# Patient Record
Sex: Female | Born: 1940 | Race: Black or African American | Hispanic: No | Marital: Married | Smoking: Never smoker
Health system: Southern US, Community
[De-identification: ages and names within clinical notes are randomized; demographics above are authoritative.]

## PROBLEM LIST (undated history)

## (undated) DIAGNOSIS — D649 Anemia, unspecified: Secondary | ICD-10-CM

## (undated) DIAGNOSIS — M1990 Unspecified osteoarthritis, unspecified site: Secondary | ICD-10-CM

## (undated) DIAGNOSIS — Z889 Allergy status to unspecified drugs, medicaments and biological substances status: Secondary | ICD-10-CM

## (undated) DIAGNOSIS — Z972 Presence of dental prosthetic device (complete) (partial): Secondary | ICD-10-CM

## (undated) DIAGNOSIS — I10 Essential (primary) hypertension: Secondary | ICD-10-CM

## (undated) HISTORY — PX: BACK SURGERY: SHX140

## (undated) HISTORY — PX: ABDOMINAL HYSTERECTOMY: SHX81

---

## 2004-08-22 ENCOUNTER — Ambulatory Visit: Payer: Self-pay | Admitting: Obstetrics and Gynecology

## 2005-09-04 ENCOUNTER — Ambulatory Visit: Payer: Self-pay | Admitting: Obstetrics and Gynecology

## 2006-07-22 ENCOUNTER — Other Ambulatory Visit: Payer: Self-pay

## 2006-07-22 ENCOUNTER — Ambulatory Visit: Payer: Self-pay | Admitting: Podiatry

## 2006-07-26 ENCOUNTER — Ambulatory Visit: Payer: Self-pay | Admitting: Podiatry

## 2007-01-20 ENCOUNTER — Ambulatory Visit: Payer: Self-pay | Admitting: Family Medicine

## 2008-01-22 ENCOUNTER — Ambulatory Visit: Payer: Self-pay | Admitting: Family Medicine

## 2009-02-01 ENCOUNTER — Ambulatory Visit: Payer: Self-pay | Admitting: Family Medicine

## 2010-02-02 ENCOUNTER — Ambulatory Visit: Payer: Self-pay | Admitting: Family Medicine

## 2011-02-21 ENCOUNTER — Ambulatory Visit: Payer: Self-pay | Admitting: Unknown Physician Specialty

## 2011-03-07 ENCOUNTER — Ambulatory Visit: Payer: Self-pay | Admitting: Unknown Physician Specialty

## 2011-03-13 ENCOUNTER — Inpatient Hospital Stay: Payer: Self-pay | Admitting: Unknown Physician Specialty

## 2011-03-16 ENCOUNTER — Encounter: Payer: Self-pay | Admitting: Internal Medicine

## 2011-03-18 DIAGNOSIS — I499 Cardiac arrhythmia, unspecified: Secondary | ICD-10-CM

## 2011-04-09 ENCOUNTER — Encounter: Payer: Self-pay | Admitting: Internal Medicine

## 2011-06-15 ENCOUNTER — Ambulatory Visit: Payer: Self-pay | Admitting: Family Medicine

## 2011-07-11 ENCOUNTER — Ambulatory Visit: Payer: Self-pay

## 2011-07-19 ENCOUNTER — Ambulatory Visit: Payer: Self-pay | Admitting: Urology

## 2011-08-13 ENCOUNTER — Ambulatory Visit: Payer: Self-pay | Admitting: Gastroenterology

## 2013-01-05 ENCOUNTER — Ambulatory Visit: Payer: Self-pay | Admitting: Internal Medicine

## 2013-12-30 DIAGNOSIS — H18892 Other specified disorders of cornea, left eye: Secondary | ICD-10-CM | POA: Insufficient documentation

## 2013-12-30 DIAGNOSIS — I10 Essential (primary) hypertension: Secondary | ICD-10-CM | POA: Insufficient documentation

## 2013-12-30 DIAGNOSIS — H401130 Primary open-angle glaucoma, bilateral, stage unspecified: Secondary | ICD-10-CM | POA: Insufficient documentation

## 2013-12-30 DIAGNOSIS — H04123 Dry eye syndrome of bilateral lacrimal glands: Secondary | ICD-10-CM | POA: Insufficient documentation

## 2014-01-06 DIAGNOSIS — E669 Obesity, unspecified: Secondary | ICD-10-CM | POA: Insufficient documentation

## 2014-02-01 ENCOUNTER — Ambulatory Visit: Payer: Self-pay | Admitting: Internal Medicine

## 2014-07-12 DIAGNOSIS — I10 Essential (primary) hypertension: Secondary | ICD-10-CM | POA: Diagnosis not present

## 2014-07-12 DIAGNOSIS — N289 Disorder of kidney and ureter, unspecified: Secondary | ICD-10-CM | POA: Diagnosis not present

## 2014-07-12 DIAGNOSIS — M25569 Pain in unspecified knee: Secondary | ICD-10-CM | POA: Diagnosis not present

## 2014-07-19 DIAGNOSIS — M1712 Unilateral primary osteoarthritis, left knee: Secondary | ICD-10-CM | POA: Diagnosis not present

## 2014-08-12 DIAGNOSIS — M1712 Unilateral primary osteoarthritis, left knee: Secondary | ICD-10-CM | POA: Diagnosis not present

## 2014-08-19 DIAGNOSIS — M1712 Unilateral primary osteoarthritis, left knee: Secondary | ICD-10-CM | POA: Diagnosis not present

## 2014-08-26 DIAGNOSIS — M1712 Unilateral primary osteoarthritis, left knee: Secondary | ICD-10-CM | POA: Diagnosis not present

## 2014-12-08 DIAGNOSIS — H40003 Preglaucoma, unspecified, bilateral: Secondary | ICD-10-CM | POA: Diagnosis not present

## 2015-01-12 DIAGNOSIS — I10 Essential (primary) hypertension: Secondary | ICD-10-CM | POA: Diagnosis not present

## 2015-02-02 DIAGNOSIS — M542 Cervicalgia: Secondary | ICD-10-CM | POA: Diagnosis not present

## 2015-02-02 DIAGNOSIS — M25511 Pain in right shoulder: Secondary | ICD-10-CM | POA: Diagnosis not present

## 2015-02-17 ENCOUNTER — Other Ambulatory Visit: Payer: Self-pay | Admitting: Internal Medicine

## 2015-02-17 DIAGNOSIS — Z1231 Encounter for screening mammogram for malignant neoplasm of breast: Secondary | ICD-10-CM

## 2015-02-22 ENCOUNTER — Ambulatory Visit
Admission: RE | Admit: 2015-02-22 | Discharge: 2015-02-22 | Disposition: A | Discharge status: Home / Self Care | Payer: Commercial Managed Care - HMO | Source: Ambulatory Visit | Attending: Internal Medicine | Admitting: Internal Medicine

## 2015-02-22 ENCOUNTER — Other Ambulatory Visit: Payer: Self-pay | Admitting: Internal Medicine

## 2015-02-22 DIAGNOSIS — Z1231 Encounter for screening mammogram for malignant neoplasm of breast: Secondary | ICD-10-CM | POA: Diagnosis not present

## 2015-04-04 DIAGNOSIS — N289 Disorder of kidney and ureter, unspecified: Secondary | ICD-10-CM | POA: Diagnosis not present

## 2015-04-04 DIAGNOSIS — M25511 Pain in right shoulder: Secondary | ICD-10-CM | POA: Diagnosis not present

## 2015-04-19 DIAGNOSIS — S161XXA Strain of muscle, fascia and tendon at neck level, initial encounter: Secondary | ICD-10-CM | POA: Diagnosis not present

## 2015-04-19 DIAGNOSIS — N289 Disorder of kidney and ureter, unspecified: Secondary | ICD-10-CM | POA: Diagnosis not present

## 2015-04-19 DIAGNOSIS — S161XXS Strain of muscle, fascia and tendon at neck level, sequela: Secondary | ICD-10-CM | POA: Diagnosis not present

## 2015-04-19 DIAGNOSIS — M47812 Spondylosis without myelopathy or radiculopathy, cervical region: Secondary | ICD-10-CM | POA: Diagnosis not present

## 2015-06-06 DIAGNOSIS — H40003 Preglaucoma, unspecified, bilateral: Secondary | ICD-10-CM | POA: Diagnosis not present

## 2015-06-17 DIAGNOSIS — H40003 Preglaucoma, unspecified, bilateral: Secondary | ICD-10-CM | POA: Diagnosis not present

## 2015-07-08 DIAGNOSIS — I10 Essential (primary) hypertension: Secondary | ICD-10-CM | POA: Diagnosis not present

## 2015-07-15 DIAGNOSIS — M25562 Pain in left knee: Secondary | ICD-10-CM | POA: Diagnosis not present

## 2015-07-15 DIAGNOSIS — N289 Disorder of kidney and ureter, unspecified: Secondary | ICD-10-CM | POA: Diagnosis not present

## 2015-07-15 DIAGNOSIS — E6609 Other obesity due to excess calories: Secondary | ICD-10-CM | POA: Diagnosis not present

## 2015-07-15 DIAGNOSIS — E785 Hyperlipidemia, unspecified: Secondary | ICD-10-CM | POA: Insufficient documentation

## 2015-07-15 DIAGNOSIS — I10 Essential (primary) hypertension: Secondary | ICD-10-CM | POA: Diagnosis not present

## 2015-07-15 DIAGNOSIS — Z0001 Encounter for general adult medical examination with abnormal findings: Secondary | ICD-10-CM | POA: Diagnosis not present

## 2015-08-05 DIAGNOSIS — M1712 Unilateral primary osteoarthritis, left knee: Secondary | ICD-10-CM | POA: Diagnosis not present

## 2015-08-05 DIAGNOSIS — M25562 Pain in left knee: Secondary | ICD-10-CM | POA: Diagnosis not present

## 2015-08-16 DIAGNOSIS — M1712 Unilateral primary osteoarthritis, left knee: Secondary | ICD-10-CM | POA: Diagnosis not present

## 2015-09-14 ENCOUNTER — Encounter
Admission: RE | Admit: 2015-09-14 | Discharge: 2015-09-14 | Disposition: A | Discharge status: Home / Self Care | Payer: Commercial Managed Care - HMO | Source: Ambulatory Visit | Attending: Orthopedic Surgery | Admitting: Orthopedic Surgery

## 2015-09-14 DIAGNOSIS — Z01810 Encounter for preprocedural cardiovascular examination: Secondary | ICD-10-CM | POA: Diagnosis not present

## 2015-09-14 DIAGNOSIS — I10 Essential (primary) hypertension: Secondary | ICD-10-CM | POA: Diagnosis not present

## 2015-09-14 DIAGNOSIS — Z01812 Encounter for preprocedural laboratory examination: Secondary | ICD-10-CM | POA: Insufficient documentation

## 2015-09-14 HISTORY — DX: Essential (primary) hypertension: I10

## 2015-09-14 HISTORY — DX: Allergy status to unspecified drugs, medicaments and biological substances: Z88.9

## 2015-09-14 HISTORY — DX: Anemia, unspecified: D64.9

## 2015-09-14 HISTORY — DX: Unspecified osteoarthritis, unspecified site: M19.90

## 2015-09-14 LAB — BASIC METABOLIC PANEL
Anion gap: 9 (ref 5–15)
BUN: 14 mg/dL (ref 6–20)
CHLORIDE: 100 mmol/L — AB (ref 101–111)
CO2: 28 mmol/L (ref 22–32)
CREATININE: 1.39 mg/dL — AB (ref 0.44–1.00)
Calcium: 9.7 mg/dL (ref 8.9–10.3)
GFR calc Af Amer: 42 mL/min — ABNORMAL LOW (ref >60)
GFR, EST NON AFRICAN AMERICAN: 36 mL/min — AB (ref >60)
GLUCOSE: 90 mg/dL (ref 65–99)
Potassium: 3 mmol/L — ABNORMAL LOW (ref 3.5–5.1)
SODIUM: 137 mmol/L (ref 135–145)

## 2015-09-14 LAB — URINALYSIS COMPLETE WITH MICROSCOPIC (ARMC ONLY)
BACTERIA UA: NONE SEEN
BILIRUBIN URINE: NEGATIVE
GLUCOSE, UA: NEGATIVE mg/dL
Hgb urine dipstick: NEGATIVE
Ketones, ur: NEGATIVE mg/dL
Leukocytes, UA: NEGATIVE
NITRITE: NEGATIVE
Protein, ur: NEGATIVE mg/dL
RBC / HPF: NONE SEEN RBC/hpf (ref 0–5)
SPECIFIC GRAVITY, URINE: 1.011 (ref 1.005–1.030)
WBC, UA: NONE SEEN WBC/hpf (ref 0–5)
pH: 7 (ref 5.0–8.0)

## 2015-09-14 LAB — CBC
HCT: 34.5 % — ABNORMAL LOW (ref 35.0–47.0)
Hemoglobin: 11.7 g/dL — ABNORMAL LOW (ref 12.0–16.0)
MCH: 27.7 pg (ref 26.0–34.0)
MCHC: 33.9 g/dL (ref 32.0–36.0)
MCV: 81.7 fL (ref 80.0–100.0)
PLATELETS: 339 10*3/uL (ref 150–440)
RBC: 4.22 MIL/uL (ref 3.80–5.20)
RDW: 16.1 % — ABNORMAL HIGH (ref 11.5–14.5)
WBC: 11.2 10*3/uL — ABNORMAL HIGH (ref 3.6–11.0)

## 2015-09-14 LAB — SURGICAL PCR SCREEN
MRSA, PCR: NEGATIVE
Staphylococcus aureus: NEGATIVE

## 2015-09-14 LAB — TYPE AND SCREEN
ABO/RH(D): A POS
ANTIBODY SCREEN: NEGATIVE

## 2015-09-14 LAB — PROTIME-INR
INR: 1.13
Prothrombin Time: 14.7 seconds (ref 11.4–15.0)

## 2015-09-14 LAB — SEDIMENTATION RATE: Sed Rate: 58 mm/hr — ABNORMAL HIGH (ref 0–30)

## 2015-09-14 LAB — ABO/RH: ABO/RH(D): A POS

## 2015-09-14 LAB — APTT: APTT: 34 s (ref 24–36)

## 2015-09-14 NOTE — Patient Instructions (Addendum)
  Your procedure is scheduled on:September 28, 2015 (Wednesday) Report to Day Surgery.[facility]) Second Floor To find out your arrival time please call [phone] between 1PM - 3PM on September 27, 2015 (Tuesday).  Remember: Instructions that are not followed completely may result in serious medical risk, up to and including death, or upon the discretion of your surgeon and anesthesiologist your surgery may need to be rescheduled.    __x__ 1. Do not eat food or drink liquids after midnight. No gum chewing or hard candies.     __x__ 2. No Alcohol for 24 hours before or after surgery.   ____ 3. Bring all medications with you on the day of surgery if instructed.    __x_ 4. Notify your doctor if there is any change in your medical condition     (cold, fever, infections).     Do not wear jewelry, make-up, hairpins, clips or nail polish.  Do not wear lotions, powders, or perfumes. You may wear deodorant.  Do not shave 48 hours prior to surgery. Men may shave face and neck.  Do not bring valuables to the hospital.    [facility] any belongings or valuables.               Contacts, dentures or bridgework may not be worn into surgery.  Leave your suitcase in the car. After surgery it may be brought to your room.  For patients admitted to the hospital, discharge time is determined by your                treatment team.   Patients discharged the day of surgery will not be allowed to drive home.   Please read over the following fact sheets that you were given:   MRSA Information and Surgical Site Infection Prevention   ____ Take these medicines the morning of surgery with A SIP OF WATER:    1.   2.   3.   4.  5.  6.  ____ Fleet Enema (as directed)   __x__ Use CHG Soap as directed  ____ Use inhalers on the day of surgery  ____ Stop metformin 2 days prior to surgery    ____ Take 1/2 of usual insulin dose the night before surgery and none on the morning of  surgery.   __x__ Stop Coumadin/Plavix/aspirin on (STOP ASPIRIN ONE WEEK BEFORE SURGERY)  __x__ Stop Anti-inflammatories on (NO NSAIDS) TYLENOL OK TO TAKE FOR PAIN IF NEEDED   ____ Stop supplements until after surgery.    ____ Bring C-Pap to the hospital.

## 2015-09-15 LAB — URINE CULTURE

## 2015-09-15 NOTE — Pre-Procedure Instructions (Signed)
AS INSTRUCTED BY DR Lorin PicketM THOMAS, REQUEST FOR CLEARANCE /EKG/LABS WITH KT 3.0 CALLED AND FAXED TO Consuella LoseELAINE AT DR [facility]

## 2015-09-15 NOTE — Pre-Procedure Instructions (Signed)
Notified and faxed Consuella LoseElaine at Dr Ernest PineHooten office of Urine Cx results

## 2015-09-16 NOTE — Pre-Procedure Instructions (Signed)
Received fax from Consuella LoseElaine at Dr Ernest PineHooten office "UA OKAY NO S&S UTI", fax placed in chart

## 2015-09-28 ENCOUNTER — Inpatient Hospital Stay
Admission: RE | Admit: 2015-09-28 | Discharge: 2015-09-30 | DRG: 470 | Disposition: A | Discharge status: Home Health | Payer: Commercial Managed Care - HMO | Source: Ambulatory Visit | Attending: Orthopedic Surgery | Admitting: Orthopedic Surgery

## 2015-09-28 ENCOUNTER — Inpatient Hospital Stay: Payer: Commercial Managed Care - HMO

## 2015-09-28 ENCOUNTER — Inpatient Hospital Stay: Payer: Commercial Managed Care - HMO | Admitting: Anesthesiology

## 2015-09-28 ENCOUNTER — Encounter: Payer: Self-pay | Admitting: Orthopedic Surgery

## 2015-09-28 ENCOUNTER — Encounter
Admission: RE | Disposition: A | Discharge status: Home Health | Payer: Self-pay | Source: Ambulatory Visit | Attending: Orthopedic Surgery

## 2015-09-28 DIAGNOSIS — H409 Unspecified glaucoma: Secondary | ICD-10-CM | POA: Diagnosis not present

## 2015-09-28 DIAGNOSIS — M5136 Other intervertebral disc degeneration, lumbar region: Secondary | ICD-10-CM | POA: Diagnosis present

## 2015-09-28 DIAGNOSIS — Z888 Allergy status to other drugs, medicaments and biological substances status: Secondary | ICD-10-CM

## 2015-09-28 DIAGNOSIS — Z981 Arthrodesis status: Secondary | ICD-10-CM

## 2015-09-28 DIAGNOSIS — Z7982 Long term (current) use of aspirin: Secondary | ICD-10-CM | POA: Diagnosis not present

## 2015-09-28 DIAGNOSIS — E669 Obesity, unspecified: Secondary | ICD-10-CM | POA: Diagnosis present

## 2015-09-28 DIAGNOSIS — I10 Essential (primary) hypertension: Secondary | ICD-10-CM | POA: Diagnosis not present

## 2015-09-28 DIAGNOSIS — J302 Other seasonal allergic rhinitis: Secondary | ICD-10-CM | POA: Diagnosis present

## 2015-09-28 DIAGNOSIS — Z96652 Presence of left artificial knee joint: Secondary | ICD-10-CM | POA: Diagnosis not present

## 2015-09-28 DIAGNOSIS — Z471 Aftercare following joint replacement surgery: Secondary | ICD-10-CM | POA: Diagnosis not present

## 2015-09-28 DIAGNOSIS — Z79899 Other long term (current) drug therapy: Secondary | ICD-10-CM

## 2015-09-28 DIAGNOSIS — D649 Anemia, unspecified: Secondary | ICD-10-CM | POA: Diagnosis present

## 2015-09-28 DIAGNOSIS — M1712 Unilateral primary osteoarthritis, left knee: Principal | ICD-10-CM | POA: Diagnosis present

## 2015-09-28 DIAGNOSIS — Z96659 Presence of unspecified artificial knee joint: Secondary | ICD-10-CM

## 2015-09-28 DIAGNOSIS — Z6839 Body mass index (BMI) 39.0-39.9, adult: Secondary | ICD-10-CM

## 2015-09-28 HISTORY — PX: KNEE ARTHROPLASTY: SHX992

## 2015-09-28 LAB — POCT I-STAT 4, (NA,K, GLUC, HGB,HCT)
GLUCOSE: 106 mg/dL — AB (ref 65–99)
HEMATOCRIT: 36 % (ref 36.0–46.0)
Hemoglobin: 12.2 g/dL (ref 12.0–15.0)
POTASSIUM: 3.5 mmol/L (ref 3.5–5.1)
SODIUM: 142 mmol/L (ref 135–145)

## 2015-09-28 SURGERY — ARTHROPLASTY, KNEE, TOTAL, USING IMAGELESS COMPUTER-ASSISTED NAVIGATION
Anesthesia: Spinal | Laterality: Left | Wound class: Clean

## 2015-09-28 MED ORDER — NEOMYCIN-POLYMYXIN B GU 40-200000 IR SOLN
Status: DC | PRN
  Administered 2015-09-28: 14 mL

## 2015-09-28 MED ORDER — FENTANYL CITRATE (PF) 100 MCG/2ML IJ SOLN
INTRAMUSCULAR | Status: AC
  Administered 2015-09-28: 25 ug via INTRAVENOUS
  Filled 2015-09-28: qty 2

## 2015-09-28 MED ORDER — MAGNESIUM HYDROXIDE 400 MG/5ML PO SUSP
30.0000 mL | Freq: Every day | ORAL | Status: DC | PRN
  Administered 2015-09-30: 30 mL via ORAL
  Filled 2015-09-28: qty 30

## 2015-09-28 MED ORDER — FERROUS SULFATE 325 (65 FE) MG PO TABS
325.0000 mg | ORAL_TABLET | Freq: Two times a day (BID) | ORAL | Status: DC
  Administered 2015-09-29 – 2015-09-30 (×3): 325 mg via ORAL
  Filled 2015-09-28 (×3): qty 1

## 2015-09-28 MED ORDER — ROCURONIUM BROMIDE 100 MG/10ML IV SOLN
INTRAVENOUS | Status: DC | PRN
  Administered 2015-09-28: 20 mg via INTRAVENOUS

## 2015-09-28 MED ORDER — MIDAZOLAM HCL 5 MG/5ML IJ SOLN
INTRAMUSCULAR | Status: DC | PRN
  Administered 2015-09-28 (×4): 1 mg via INTRAVENOUS

## 2015-09-28 MED ORDER — DORZOLAMIDE HCL-TIMOLOL MAL 2-0.5 % OP SOLN: 1.0000 [drp] | Freq: Two times a day (BID) | OPHTHALMIC | Status: DC

## 2015-09-28 MED ORDER — OXYBUTYNIN CHLORIDE 5 MG PO TABS
5.0000 mg | ORAL_TABLET | Freq: Every day | ORAL | Status: DC
  Administered 2015-09-28 – 2015-09-30 (×3): 5 mg via ORAL
  Filled 2015-09-28 (×3): qty 1

## 2015-09-28 MED ORDER — TRANEXAMIC ACID 1000 MG/10ML IV SOLN
1000.0000 mg | INTRAVENOUS | Status: AC
  Administered 2015-09-28: 1000 mg via INTRAVENOUS
  Filled 2015-09-28: qty 10

## 2015-09-28 MED ORDER — PHENYLEPHRINE HCL 10 MG/ML IJ SOLN
INTRAMUSCULAR | Status: DC | PRN
  Administered 2015-09-28: 100 ug via INTRAVENOUS

## 2015-09-28 MED ORDER — ENOXAPARIN SODIUM 30 MG/0.3ML ~~LOC~~ SOLN
30.0000 mg | Freq: Two times a day (BID) | SUBCUTANEOUS | Status: DC
  Administered 2015-09-29 – 2015-09-30 (×3): 30 mg via SUBCUTANEOUS
  Filled 2015-09-28 (×3): qty 0.3

## 2015-09-28 MED ORDER — FENTANYL CITRATE (PF) 100 MCG/2ML IJ SOLN
INTRAMUSCULAR | Status: DC | PRN
  Administered 2015-09-28: 20 ug via INTRAVENOUS
  Administered 2015-09-28: 30 ug via INTRAVENOUS
  Administered 2015-09-28: 50 ug via INTRAVENOUS
  Administered 2015-09-28: 100 ug via INTRAVENOUS

## 2015-09-28 MED ORDER — ACETAMINOPHEN 10 MG/ML IV SOLN
1000.0000 mg | Freq: Four times a day (QID) | INTRAVENOUS | Status: AC
  Administered 2015-09-28 – 2015-09-29 (×4): 1000 mg via INTRAVENOUS
  Filled 2015-09-28 (×4): qty 100

## 2015-09-28 MED ORDER — ONDANSETRON HCL 4 MG/2ML IJ SOLN
INTRAMUSCULAR | Status: AC
  Filled 2015-09-28: qty 2

## 2015-09-28 MED ORDER — SODIUM CHLORIDE 0.9 % IV SOLN
INTRAVENOUS | Status: DC | PRN
  Administered 2015-09-28: 60 mL

## 2015-09-28 MED ORDER — ACETAMINOPHEN 325 MG PO TABS: 650.0000 mg | ORAL_TABLET | Freq: Four times a day (QID) | ORAL | Status: DC | PRN

## 2015-09-28 MED ORDER — PROPOFOL 10 MG/ML IV BOLUS
INTRAVENOUS | Status: DC | PRN
Start: 2015-09-28 — End: 2015-09-28
  Administered 2015-09-28: 100 mg via INTRAVENOUS

## 2015-09-28 MED ORDER — FAMOTIDINE 20 MG PO TABS
20.0000 mg | ORAL_TABLET | Freq: Once | ORAL | Status: AC
  Administered 2015-09-28: 20 mg via ORAL

## 2015-09-28 MED ORDER — LATANOPROST 0.005 % OP SOLN
1.0000 [drp] | Freq: Every day | OPHTHALMIC | Status: DC
  Administered 2015-09-28 – 2015-09-29 (×2): 1 [drp] via OPHTHALMIC
  Filled 2015-09-28: qty 2.5

## 2015-09-28 MED ORDER — ALUM & MAG HYDROXIDE-SIMETH 200-200-20 MG/5ML PO SUSP: 30.0000 mL | ORAL | Status: DC | PRN

## 2015-09-28 MED ORDER — NEOSTIGMINE METHYLSULFATE 10 MG/10ML IV SOLN
INTRAVENOUS | Status: DC | PRN
  Administered 2015-09-28: 3 mg via INTRAVENOUS

## 2015-09-28 MED ORDER — CEFAZOLIN SODIUM-DEXTROSE 2-4 GM/100ML-% IV SOLN
2.0000 g | Freq: Once | INTRAVENOUS | Status: DC
  Filled 2015-09-28: qty 100

## 2015-09-28 MED ORDER — FENTANYL CITRATE (PF) 100 MCG/2ML IJ SOLN
25.0000 ug | INTRAMUSCULAR | Status: DC | PRN
  Administered 2015-09-28 (×4): 25 ug via INTRAVENOUS

## 2015-09-28 MED ORDER — CEFAZOLIN SODIUM-DEXTROSE 2-3 GM-% IV SOLR
INTRAVENOUS | Status: AC
  Administered 2015-09-28: 2 g via INTRAVENOUS
  Filled 2015-09-28: qty 50

## 2015-09-28 MED ORDER — DEXTROSE 5 % IV SOLN
Freq: Once | INTRAVENOUS | Status: AC
  Administered 2015-09-28: 12:00:00 via INTRAVENOUS
  Filled 2015-09-28: qty 50

## 2015-09-28 MED ORDER — DORZOLAMIDE HCL 2 % OP SOLN
1.0000 [drp] | Freq: Two times a day (BID) | OPHTHALMIC | Status: DC
  Administered 2015-09-28 – 2015-09-30 (×4): 1 [drp] via OPHTHALMIC
  Filled 2015-09-28: qty 10

## 2015-09-28 MED ORDER — PANTOPRAZOLE SODIUM 40 MG PO TBEC
40.0000 mg | DELAYED_RELEASE_TABLET | Freq: Two times a day (BID) | ORAL | Status: DC
  Administered 2015-09-28 – 2015-09-30 (×4): 40 mg via ORAL
  Filled 2015-09-28 (×4): qty 1

## 2015-09-28 MED ORDER — ADULT MULTIVITAMIN W/MINERALS CH
1.0000 | ORAL_TABLET | Freq: Every day | ORAL | Status: DC
Start: 2015-09-28 — End: 2015-09-30
  Administered 2015-09-28 – 2015-09-30 (×3): 1 via ORAL
  Filled 2015-09-28 (×3): qty 1

## 2015-09-28 MED ORDER — HYDROCHLOROTHIAZIDE 25 MG PO TABS
12.5000 mg | ORAL_TABLET | Freq: Every day | ORAL | Status: DC
  Administered 2015-09-29 – 2015-09-30 (×2): 12.5 mg via ORAL
  Filled 2015-09-28 (×3): qty 1

## 2015-09-28 MED ORDER — CALCIUM CARBONATE-VITAMIN D 500-200 MG-UNIT PO TABS
1.0000 | ORAL_TABLET | Freq: Every day | ORAL | Status: DC
  Administered 2015-09-28 – 2015-09-30 (×3): 1 via ORAL
  Filled 2015-09-28 (×3): qty 1

## 2015-09-28 MED ORDER — ONDANSETRON HCL 4 MG/2ML IJ SOLN
4.0000 mg | Freq: Once | INTRAMUSCULAR | Status: AC | PRN
  Administered 2015-09-28: 4 mg via INTRAVENOUS

## 2015-09-28 MED ORDER — ACETAMINOPHEN 10 MG/ML IV SOLN
INTRAVENOUS | Status: DC | PRN
  Administered 2015-09-28: 1000 mg via INTRAVENOUS

## 2015-09-28 MED ORDER — LACTATED RINGERS IV SOLN
INTRAVENOUS | Status: DC
  Administered 2015-09-28 (×3): via INTRAVENOUS

## 2015-09-28 MED ORDER — FLEET ENEMA 7-19 GM/118ML RE ENEM: 1.0000 | ENEMA | Freq: Once | RECTAL | Status: DC | PRN

## 2015-09-28 MED ORDER — SODIUM CHLORIDE 0.9 % IJ SOLN
INTRAMUSCULAR | Status: AC
  Filled 2015-09-28: qty 50

## 2015-09-28 MED ORDER — ONDANSETRON HCL 4 MG PO TABS: 4.0000 mg | ORAL_TABLET | Freq: Four times a day (QID) | ORAL | Status: DC | PRN

## 2015-09-28 MED ORDER — SODIUM CHLORIDE FLUSH 0.9 % IV SOLN
INTRAVENOUS | Status: AC
  Filled 2015-09-28: qty 3

## 2015-09-28 MED ORDER — CELECOXIB 200 MG PO CAPS
200.0000 mg | ORAL_CAPSULE | Freq: Two times a day (BID) | ORAL | Status: DC
  Administered 2015-09-28 – 2015-09-30 (×4): 200 mg via ORAL
  Filled 2015-09-28 (×4): qty 1

## 2015-09-28 MED ORDER — TRAMADOL HCL 50 MG PO TABS
50.0000 mg | ORAL_TABLET | ORAL | Status: DC | PRN
  Administered 2015-09-29: 50 mg via ORAL
  Administered 2015-09-30: 100 mg via ORAL
  Filled 2015-09-28: qty 1
  Filled 2015-09-28: qty 2

## 2015-09-28 MED ORDER — BISACODYL 10 MG RE SUPP
10.0000 mg | Freq: Every day | RECTAL | Status: DC | PRN
  Administered 2015-09-30: 10 mg via RECTAL
  Filled 2015-09-28: qty 1

## 2015-09-28 MED ORDER — ONDANSETRON HCL 4 MG/2ML IJ SOLN
INTRAMUSCULAR | Status: DC | PRN
  Administered 2015-09-28: 4 mg via INTRAVENOUS

## 2015-09-28 MED ORDER — SENNOSIDES-DOCUSATE SODIUM 8.6-50 MG PO TABS
1.0000 | ORAL_TABLET | Freq: Two times a day (BID) | ORAL | Status: DC
  Administered 2015-09-28 – 2015-09-30 (×4): 1 via ORAL
  Filled 2015-09-28 (×4): qty 1

## 2015-09-28 MED ORDER — SUCCINYLCHOLINE CHLORIDE 20 MG/ML IJ SOLN
INTRAMUSCULAR | Status: DC | PRN
  Administered 2015-09-28: 100 mg via INTRAVENOUS

## 2015-09-28 MED ORDER — DIPHENHYDRAMINE HCL 12.5 MG/5ML PO ELIX: 12.5000 mg | ORAL_SOLUTION | ORAL | Status: DC | PRN

## 2015-09-28 MED ORDER — GLYCOPYRROLATE 0.2 MG/ML IJ SOLN
INTRAMUSCULAR | Status: DC | PRN
  Administered 2015-09-28: 0.6 mg via INTRAVENOUS

## 2015-09-28 MED ORDER — BUPIVACAINE-EPINEPHRINE 0.25% -1:200000 IJ SOLN
INTRAMUSCULAR | Status: DC | PRN
  Administered 2015-09-28: 30 mL

## 2015-09-28 MED ORDER — MORPHINE SULFATE (PF) 2 MG/ML IV SOLN
2.0000 mg | INTRAVENOUS | Status: DC | PRN
  Administered 2015-09-28 (×2): 2 mg via INTRAVENOUS
  Filled 2015-09-28 (×2): qty 1

## 2015-09-28 MED ORDER — SODIUM CHLORIDE 0.9 % IV SOLN
INTRAVENOUS | Status: DC
  Administered 2015-09-28 – 2015-09-29 (×3): via INTRAVENOUS

## 2015-09-28 MED ORDER — TIMOLOL MALEATE 0.5 % OP SOLN
1.0000 [drp] | Freq: Two times a day (BID) | OPHTHALMIC | Status: DC
  Administered 2015-09-28 – 2015-09-30 (×4): 1 [drp] via OPHTHALMIC
  Filled 2015-09-28: qty 5

## 2015-09-28 MED ORDER — TRANEXAMIC ACID 1000 MG/10ML IV SOLN
1000.0000 mg | Freq: Once | INTRAVENOUS | Status: AC
  Administered 2015-09-28: 1000 mg via INTRAVENOUS
  Filled 2015-09-28: qty 10

## 2015-09-28 MED ORDER — ONDANSETRON HCL 4 MG/2ML IJ SOLN
4.0000 mg | Freq: Four times a day (QID) | INTRAMUSCULAR | Status: DC | PRN
  Administered 2015-09-28 – 2015-09-29 (×2): 4 mg via INTRAVENOUS
  Filled 2015-09-28 (×2): qty 2

## 2015-09-28 MED ORDER — CEFAZOLIN SODIUM-DEXTROSE 2-4 GM/100ML-% IV SOLN
2.0000 g | Freq: Four times a day (QID) | INTRAVENOUS | Status: AC
  Administered 2015-09-28 – 2015-09-29 (×4): 2 g via INTRAVENOUS
  Filled 2015-09-28 (×5): qty 100

## 2015-09-28 MED ORDER — OXYCODONE HCL 5 MG PO TABS
5.0000 mg | ORAL_TABLET | ORAL | Status: DC | PRN
  Administered 2015-09-28 (×2): 5 mg via ORAL
  Administered 2015-09-29 (×2): 10 mg via ORAL
  Administered 2015-09-29: 5 mg via ORAL
  Administered 2015-09-30: 10 mg via ORAL
  Filled 2015-09-28: qty 2
  Filled 2015-09-28 (×2): qty 1
  Filled 2015-09-28: qty 2
  Filled 2015-09-28: qty 1
  Filled 2015-09-28: qty 2

## 2015-09-28 MED ORDER — METOCLOPRAMIDE HCL 10 MG PO TABS
10.0000 mg | ORAL_TABLET | Freq: Three times a day (TID) | ORAL | Status: DC
  Administered 2015-09-28 – 2015-09-30 (×7): 10 mg via ORAL
  Filled 2015-09-28 (×7): qty 1

## 2015-09-28 MED ORDER — BUPIVACAINE-EPINEPHRINE (PF) 0.25% -1:200000 IJ SOLN
INTRAMUSCULAR | Status: AC
  Filled 2015-09-28: qty 30

## 2015-09-28 MED ORDER — ACETAMINOPHEN 10 MG/ML IV SOLN
INTRAVENOUS | Status: AC
  Filled 2015-09-28: qty 100

## 2015-09-28 MED ORDER — NEOMYCIN-POLYMYXIN B GU 40-200000 IR SOLN
Status: AC
  Filled 2015-09-28: qty 20

## 2015-09-28 MED ORDER — PHENOL 1.4 % MT LIQD: 1.0000 | OROMUCOSAL | Status: DC | PRN

## 2015-09-28 MED ORDER — ACETAMINOPHEN 650 MG RE SUPP: 650.0000 mg | Freq: Four times a day (QID) | RECTAL | Status: DC | PRN

## 2015-09-28 MED ORDER — FAMOTIDINE 20 MG PO TABS
ORAL_TABLET | ORAL | Status: AC
  Administered 2015-09-28: 20 mg via ORAL
  Filled 2015-09-28: qty 1

## 2015-09-28 MED ORDER — MENTHOL 3 MG MT LOZG: 1.0000 | LOZENGE | OROMUCOSAL | Status: DC | PRN

## 2015-09-28 MED ORDER — BUPIVACAINE LIPOSOME 1.3 % IJ SUSP
INTRAMUSCULAR | Status: AC
  Filled 2015-09-28: qty 20

## 2015-09-28 SURGICAL SUPPLY — 58 items
AUTOTRANSFUS HAS 1/8 (MISCELLANEOUS) ×3
BATTERY INSTRU NAVIGATION (MISCELLANEOUS) ×12 IMPLANT
BLADE SAW 1 (BLADE) ×3 IMPLANT
BLADE SAW 1/2 (BLADE) ×3 IMPLANT
BONE CEMENT GENTAMICIN (Cement) ×6 IMPLANT
CANISTER SUCT 1200ML W/VALVE (MISCELLANEOUS) ×3 IMPLANT
CANISTER SUCT 3000ML (MISCELLANEOUS) ×6 IMPLANT
CAP KNEE TOTAL 3 SIGMA ×3 IMPLANT
CATH TRAY METER 16FR LF (MISCELLANEOUS) ×3 IMPLANT
CEMENT BONE GENTAMICIN 40 (Cement) ×2 IMPLANT
COOLER POLAR GLACIER W/PUMP (MISCELLANEOUS) ×3 IMPLANT
DRAPE SHEET LG 3/4 BI-LAMINATE (DRAPES) ×3 IMPLANT
DRSG DERMACEA 8X12 NADH (GAUZE/BANDAGES/DRESSINGS) ×3 IMPLANT
DRSG OPSITE POSTOP 4X14 (GAUZE/BANDAGES/DRESSINGS) ×3 IMPLANT
DRSG TEGADERM 4X4.75 (GAUZE/BANDAGES/DRESSINGS) ×3 IMPLANT
DURAPREP 26ML APPLICATOR (WOUND CARE) ×6 IMPLANT
ELECT CAUTERY BLADE 6.4 (BLADE) ×3 IMPLANT
ELECT REM PT RETURN 9FT ADLT (ELECTROSURGICAL) ×3
ELECTRODE REM PT RTRN 9FT ADLT (ELECTROSURGICAL) ×1 IMPLANT
EX-PIN ORTHOLOCK NAV 4X150 (PIN) ×6 IMPLANT
GLOVE BIOGEL M STRL SZ7.5 (GLOVE) ×6 IMPLANT
GLOVE INDICATOR 8.0 STRL GRN (GLOVE) ×3 IMPLANT
GLOVE SURG 9.0 ORTHO LTXF (GLOVE) ×3 IMPLANT
GLOVE SURG ORTHO 9.0 STRL STRW (GLOVE) ×3 IMPLANT
GOWN STRL REUS W/ TWL LRG LVL3 (GOWN DISPOSABLE) ×2 IMPLANT
GOWN STRL REUS W/TWL 2XL LVL3 (GOWN DISPOSABLE) ×3 IMPLANT
GOWN STRL REUS W/TWL LRG LVL3 (GOWN DISPOSABLE) ×4
HANDPIECE SUCTION TUBG SURGILV (MISCELLANEOUS) ×3 IMPLANT
HOLDER FOLEY CATH W/STRAP (MISCELLANEOUS) ×3 IMPLANT
HOOD PEEL AWAY FLYTE STAYCOOL (MISCELLANEOUS) ×6 IMPLANT
KIT RM TURNOVER STRD PROC AR (KITS) ×3 IMPLANT
KNIFE SCULPS 14X20 (INSTRUMENTS) ×3 IMPLANT
NDL SAFETY 18GX1.5 (NEEDLE) ×3 IMPLANT
NEEDLE SPNL 20GX3.5 QUINCKE YW (NEEDLE) ×3 IMPLANT
NS IRRIG 500ML POUR BTL (IV SOLUTION) ×3 IMPLANT
PACK TOTAL KNEE (MISCELLANEOUS) ×3 IMPLANT
PAD WRAPON POLAR KNEE (MISCELLANEOUS) ×1 IMPLANT
PIN DRILL QUICK PACK ×3 IMPLANT
PIN FIXATION 1/8DIA X 3INL (PIN) ×3 IMPLANT
SOL .9 NS 3000ML IRR  AL (IV SOLUTION) ×2
SOL .9 NS 3000ML IRR UROMATIC (IV SOLUTION) ×1 IMPLANT
SOL PREP PVP 2OZ (MISCELLANEOUS) ×3
SOLUTION PREP PVP 2OZ (MISCELLANEOUS) ×1 IMPLANT
SPONGE DRAIN TRACH 4X4 STRL 2S (GAUZE/BANDAGES/DRESSINGS) ×3 IMPLANT
STAPLER SKIN PROX 35W (STAPLE) ×3 IMPLANT
STOCKINETTE IMPERV 14X48 (MISCELLANEOUS) ×3 IMPLANT
SUCTION FRAZIER HANDLE 10FR (MISCELLANEOUS) ×2
SUCTION TUBE FRAZIER 10FR DISP (MISCELLANEOUS) ×1 IMPLANT
SUT VIC AB 0 CT1 36 (SUTURE) ×3 IMPLANT
SUT VIC AB 1 CT1 36 (SUTURE) ×6 IMPLANT
SUT VIC AB 2-0 CT2 27 (SUTURE) ×3 IMPLANT
SYR 20CC LL (SYRINGE) ×3 IMPLANT
SYR 30ML LL (SYRINGE) ×3 IMPLANT
SYR 50ML LL SCALE MARK (SYRINGE) ×3 IMPLANT
SYSTEM AUTOTRANSFUS DUAL TROCR (MISCELLANEOUS) ×1 IMPLANT
TOWEL OR 17X26 4PK STRL BLUE (TOWEL DISPOSABLE) ×3 IMPLANT
TOWER CARTRIDGE SMART MIX (DISPOSABLE) ×3 IMPLANT
WRAPON POLAR PAD KNEE (MISCELLANEOUS) ×3

## 2015-09-28 NOTE — Anesthesia Preprocedure Evaluation (Signed)
Anesthesia Evaluation  Patient identified by MRN, date of birth, ID band Patient awake    Reviewed: Allergy & Precautions, NPO status , Patient's Chart, lab work & pertinent test results  History of Anesthesia Complications Negative for: history of anesthetic complications  Airway Mallampati: III       Dental  (+) Partial Upper   Pulmonary neg pulmonary ROS,           Cardiovascular hypertension, Pt. on medications      Neuro/Psych negative neurological ROS     GI/Hepatic negative GI ROS, Neg liver ROS,   Endo/Other  negative endocrine ROS  Renal/GU Renal InsufficiencyRenal disease     Musculoskeletal  (+) Arthritis ,   Abdominal   Peds  Hematology  (+) anemia ,   Anesthesia Other Findings   Reproductive/Obstetrics                             Anesthesia Physical Anesthesia Plan  ASA: III  Anesthesia Plan: Spinal   Post-op Pain Management:    Induction:   Airway Management Planned: Nasal Cannula  Additional Equipment:   Intra-op Plan:   Post-operative Plan:   Informed Consent: I have reviewed the patients History and Physical, chart, labs and discussed the procedure including the risks, benefits and alternatives for the proposed anesthesia with the patient or authorized representative who has indicated his/her understanding and acceptance.     Plan Discussed with:   Anesthesia Plan Comments:         Anesthesia Quick Evaluation

## 2015-09-28 NOTE — H&P (Signed)
The patient has been re-examined, and the chart reviewed, and there have been no interval changes to the documented history and physical.    The risks, benefits, and alternatives have been discussed at length. The patient expressed understanding of the risks benefits and agreed with plans for surgical intervention.  James P. Hooten, Jr. M.D.    

## 2015-09-28 NOTE — Op Note (Signed)
OPERATIVE NOTE  DATE OF SURGERY:  09/28/2015  PATIENT NAME:  Linda Ibarra   DOB: March 26, 1941  MRN: [id number]  PRE-OPERATIVE DIAGNOSIS: Degenerative arthrosis of the left knee, primary  POST-OPERATIVE DIAGNOSIS:  Same  PROCEDURE:  Left total knee arthroplasty using computer-assisted navigation  SURGEON:  Jena Gauss. M.D.  ASSISTANT:  Cranston Neighbor, PA-C (present and scrubbed throughout the case, critical for assistance with exposure, retraction, instrumentation, and closure)  ANESTHESIA: spinal and general  ESTIMATED BLOOD LOSS: 50 mL  FLUIDS REPLACED: 1400 mL of crystalloid  TOURNIQUET TIME: 107 minutes  DRAINS: 2 medium drains to a reinfusion system  SOFT TISSUE RELEASES: Anterior cruciate ligament, posterior cruciate ligament, deep medial collateral ligament, patellofemoral ligament   IMPLANTS UTILIZED: DePuy PFC Sigma size 2.5 posterior stabilized femoral component (cemented), size 2.5 MBT tibial component (cemented), 35 mm 3 peg oval dome patella (cemented), and a 15 mm stabilized rotating platform polyethylene insert.  INDICATIONS FOR SURGERY: Linda Ibarra is a 75 y.o. year old female with a long history of progressive knee pain. X-rays demonstrated severe degenerative changes in tricompartmental fashion. The patient had not seen any significant improvement despite conservative nonsurgical intervention. After discussion of the risks and benefits of surgical intervention, the patient expressed understanding of the risks benefits and agree with plans for total knee arthroplasty.   The risks, benefits, and alternatives were discussed at length including but not limited to the risks of infection, bleeding, nerve injury, stiffness, blood clots, the need for revision surgery, cardiopulmonary complications, among others, and they were willing to proceed.  PROCEDURE IN DETAIL: The patient was brought into the operating room and, after adequate spinal and general anesthesia  was achieved, a tourniquet was placed on the patient's upper thigh. The patient's knee and leg were cleaned and prepped with alcohol and DuraPrep and draped in the usual sterile fashion. A "timeout" was performed as per usual protocol. The lower extremity was exsanguinated using an Esmarch, and the tourniquet was inflated to 300 mmHg. An anterior longitudinal incision was made followed by a standard mid vastus approach. The deep fibers of the medial collateral ligament were elevated in a subperiosteal fashion off of the medial flare of the tibia so as to maintain a continuous soft tissue sleeve. The patella was subluxed laterally and the patellofemoral ligament was incised. Inspection of the knee demonstrated severe degenerative changes with full-thickness loss of articular cartilage. Osteophytes were debrided using a rongeur. Anterior and posterior cruciate ligaments were excised. Two 4.0 mm Schanz pins were inserted in the femur and into the tibia for attachment of the array of trackers used for computer-assisted navigation. Hip center was identified using a circumduction technique. Distal landmarks were mapped using the computer. The distal femur and proximal tibia were mapped using the computer. The distal femoral cutting guide was positioned using computer-assisted navigation so as to achieve a 5 distal valgus cut. The femur was sized and it was felt that a size 2.5 femoral component was appropriate. A size 2.5 femoral cutting guide was positioned and the anterior cut was performed and verified using the computer. This was followed by completion of the posterior and chamfer cuts. Femoral cutting guide for the central box was then positioned in the center box cut was performed.  Attention was then directed to the proximal tibia. Medial and lateral menisci were excised. The extramedullary tibial cutting guide was positioned using computer-assisted navigation so as to achieve a 0 varus-valgus alignment and 0  posterior slope. The cut  was performed and verified using the computer. The proximal tibia was sized and it was felt that a size 2.5 tibial tray was appropriate. Tibial and femoral trials were inserted followed by insertion of a 15 mm polyethylene insert. This allowed for excellent mediolateral soft tissue balancing both in flexion and in full extension. Finally, the patella was cut and prepared so as to accommodate a 35 mm 3 peg oval dome patella. A patella trial was placed and the knee was placed through a range of motion with excellent patellar tracking appreciated. The femoral trial was removed after debridement of posterior osteophytes. The central post-hole for the tibial component was reamed followed by insertion of a keel punch. Tibial trials were then removed. Cut surfaces of bone were irrigated with copious amounts of normal saline with antibiotic solution using pulsatile lavage and then suctioned dry. Polymethylmethacrylate cement with gentamicin was prepared in the usual fashion using a vacuum mixer. Cement was applied to the cut surface of the proximal tibia as well as along the undersurface of a size 2.5 MBT tibial component. Tibial component was positioned and impacted into place. Excess cement was removed using Personal assistantreer elevators. Cement was then applied to the cut surfaces of the femur as well as along the posterior flanges of the size 2.5 femoral component. The femoral component was positioned and impacted into place. Excess cement was removed using Personal assistantreer elevators. A 15 mm polyethylene trial was inserted and the knee was brought into full extension with steady axial compression applied. Finally, cement was applied to the backside of a 35 mm 3 peg oval dome patella and the patellar component was positioned and patellar clamp applied. Excess cement was removed using Personal assistantreer elevators. After adequate curing of the cement, the tourniquet was deflated after a total tourniquet time of 107 minutes. Hemostasis  was achieved using electrocautery. The knee was irrigated with copious amounts of normal saline with antibiotic solution using pulsatile lavage and then suctioned dry. 20 mL of 1.3% Exparel in 40 mL of normal saline was injected along the posterior capsule, medial and lateral gutters, and along the arthrotomy site. A 15 mm stabilized rotating platform polyethylene insert was inserted and the knee was placed through a range of motion with excellent mediolateral soft tissue balancing appreciated and excellent patellar tracking noted. 2 medium drains were placed in the wound bed and brought out through separate stab incisions to be attached to a reinfusion system. The medial parapatellar portion of the incision was reapproximated using interrupted sutures of #1 Vicryl. Subcutaneous tissue was then injected with a total of 30 cc of 0.25% Marcaine with epinephrine. Subcutaneous tissue was approximated in layers using first #0 Vicryl followed #2-0 Vicryl. The skin was approximated with skin staples. A sterile dressing was applied.  The patient tolerated the procedure well and was transported to the recovery room in stable condition.    James P. Angie FavaHooten, Jr., M.D.

## 2015-09-28 NOTE — Anesthesia Procedure Notes (Addendum)
Spinal Patient location during procedure: OR Start time: 09/28/2015 11:50 AM End time: 09/28/2015 11:59 AM Staffing Performed by: anesthesiologist  Preanesthetic Checklist Completed: patient identified, site marked, surgical consent, pre-op evaluation, timeout performed, IV checked, risks and benefits discussed and monitors and equipment checked Spinal Block Patient position: sitting Prep: Betadine Patient monitoring: heart rate, continuous pulse ox, blood pressure and cardiac monitor Approach: midline Location: L4-5 Injection technique: single-shot Needle Needle type: Quincke  Needle gauge: 25 G Needle length: 9 cm Needle insertion depth: 8 cm Additional Notes Negative paresthesia. Negative blood return. Positive free-flowing CSF. Expiration date of kit checked and confirmed. Patient tolerated procedure well, without complications.    Procedure Name: Intubation Date/Time: 09/28/2015 12:14 PM Performed by: Jennette Bill Pre-anesthesia Checklist: Patient identified Patient Re-evaluated:Patient Re-evaluated prior to inductionOxygen Delivery Method: Circle system utilized Preoxygenation: Pre-oxygenation with 100% oxygen Intubation Type: IV induction Ventilation: Mask ventilation without difficulty Laryngoscope Size: Mac and 3 Grade View: Grade III Tube type: Oral Number of attempts: 1 Airway Equipment and Method: Stylet Secured at: 22 cm Tube secured with: Tape Dental Injury: Teeth and Oropharynx as per pre-operative assessment

## 2015-09-28 NOTE — Transfer of Care (Signed)
Immediate Anesthesia Transfer of Care Note  Patient: Linda Ibarra  Procedure(s) Performed: Procedure(s): COMPUTER ASSISTED TOTAL KNEE ARTHROPLASTY (Left)  Patient Location: PACU  Anesthesia Type:General  Level of Consciousness: awake, alert  and oriented  Airway & Oxygen Therapy: Patient Spontanous Breathing and Patient connected to face mask oxygen  Post-op Assessment: Report given to RN and Post -op Vital signs reviewed and stable  Post vital signs: Reviewed and stable  Last Vitals:  Filed Vitals:   09/28/15 0930  BP: 152/96  Pulse: 88  Temp: 36.3 C  Resp: 18    Complications: No apparent anesthesia complications

## 2015-09-28 NOTE — Brief Op Note (Signed)
09/28/2015  3:56 PM  PATIENT:  Linda HuxleyEthel W Ibarra  75 y.o. female  PRE-OPERATIVE DIAGNOSIS:  PRIMARY OSTEOARTHRITIS of the left knee  POST-OPERATIVE DIAGNOSIS:  Same  PROCEDURE:  Procedure(s): COMPUTER ASSISTED TOTAL KNEE ARTHROPLASTY (Left)  SURGEON:  Surgeon(s) and Role:    * Donato HeinzJames P Klaire Court, MD - Primary  ASSISTANTS: Cranston Neighborhris Gaines, PA-C   ANESTHESIA:   spinal and general  EBL:  Total I/O In: 400 [I.V.:400] Out: 300 [Urine:250; Blood:50]  BLOOD ADMINISTERED:none  DRAINS: 2 medium drains to a reinfusion system   LOCAL MEDICATIONS USED:  MARCAINE    and OTHER Exparel  SPECIMEN:  No Specimen  DISPOSITION OF SPECIMEN:  N/A  COUNTS:  YES  TOURNIQUET:   107 minutes  DICTATION: .Dragon Dictation  PLAN OF CARE: Admit to inpatient   PATIENT DISPOSITION:  PACU - hemodynamically stable.   Delay start of Pharmacological VTE agent (>24hrs) due to surgical blood loss or risk of bleeding: yes

## 2015-09-29 ENCOUNTER — Encounter: Payer: Self-pay | Admitting: Orthopedic Surgery

## 2015-09-29 LAB — CBC
HEMATOCRIT: 28.3 % — AB (ref 35.0–47.0)
HEMOGLOBIN: 9.4 g/dL — AB (ref 12.0–16.0)
MCH: 27.9 pg (ref 26.0–34.0)
MCHC: 33.3 g/dL (ref 32.0–36.0)
MCV: 83.7 fL (ref 80.0–100.0)
Platelets: 234 10*3/uL (ref 150–440)
RBC: 3.38 MIL/uL — AB (ref 3.80–5.20)
RDW: 16.4 % — ABNORMAL HIGH (ref 11.5–14.5)
WBC: 12.2 10*3/uL — ABNORMAL HIGH (ref 3.6–11.0)

## 2015-09-29 LAB — BASIC METABOLIC PANEL
Anion gap: 3 — ABNORMAL LOW (ref 5–15)
BUN: 16 mg/dL (ref 6–20)
CHLORIDE: 108 mmol/L (ref 101–111)
CO2: 25 mmol/L (ref 22–32)
Calcium: 7.9 mg/dL — ABNORMAL LOW (ref 8.9–10.3)
Creatinine, Ser: 1.43 mg/dL — ABNORMAL HIGH (ref 0.44–1.00)
GFR calc Af Amer: 41 mL/min — ABNORMAL LOW (ref >60)
GFR calc non Af Amer: 35 mL/min — ABNORMAL LOW (ref >60)
Glucose, Bld: 120 mg/dL — ABNORMAL HIGH (ref 65–99)
POTASSIUM: 3.7 mmol/L (ref 3.5–5.1)
SODIUM: 136 mmol/L (ref 135–145)

## 2015-09-29 NOTE — Evaluation (Signed)
Physical Therapy Evaluation Patient Details Name: Linda Ibarra MRN: [id number] DOB: 09-08-1940 Today's Date: 09/29/2015   History of Present Illness  75 yo F s/p elective L TKA. She is WBAT. Performed L SLR x10 no KI required.  Clinical Impression  Pt demonstrated generalized weakness and difficulty walking s/p L TKA. Her pain is well controlled. She is mod I for bed mobility with use of rail. For transfers and gait of 110 ft with FWW she requires min guard. She demonstrated slow but steady gait with no LOB. AAROM L knee is -4 to 70 degrees. HHPT recommended after hospital discharge to continue to progress towards PLOF and increase knee ROM/strength. Pt will benefit from skilled PT services to increase functional I and mobility for safe discharge.     Follow Up Recommendations Home health PT    Equipment Recommendations  None recommended by PT    Recommendations for Other Services       Precautions / Restrictions Precautions Precautions: Knee Restrictions Weight Bearing Restrictions: Yes LLE Weight Bearing: Weight bearing as tolerated      Mobility  Bed Mobility Overal bed mobility: Modified Independent             General bed mobility comments: uses rail  Transfers Overall transfer level: Needs assistance Equipment used: Rolling walker (2 wheeled) Transfers: Sit to/from [employer] Transfers Sit to Stand: Min guard Stand pivot transfers: Min guard       General transfer comment: cues for hand placement  Ambulation/Gait Ambulation/Gait assistance: Min guard Ambulation Distance (Feet): 110 Feet Assistive device: Rolling walker (2 wheeled) Gait Pattern/deviations: Step-to pattern;Antalgic;Decreased stride length     General Gait Details: slow but steady gait with no LOB  Stairs            Wheelchair Mobility    Modified Rankin (Stroke Patients Only)       Balance                                              Pertinent Vitals/Pain Pain Assessment: 0-10 Pain Score: 2  Pain Location: L knee Pain Descriptors / Indicators: Aching Pain Intervention(s): Limited activity within patient's tolerance;Monitored during session;Premedicated before session    Home Living Family/patient expects to be discharged to:: Private residence Living Arrangements: Spouse/significant other Available Help at Discharge: Family Type of Home: House Home Access: Stairs to enter   Secretary/administrator of Steps: 1 Home Layout: One level Home Equipment: Environmental consultant - 2 wheels;Cane - single point      Prior Function Level of Independence: Independent with assistive device(s)         Comments: Ambulated with cane.     Hand Dominance        Extremity/Trunk Assessment   Upper Extremity Assessment: Overall WFL for tasks assessed           Lower Extremity Assessment: Overall WFL for tasks assessed;LLE deficits/detail   LLE Deficits / Details: strength grossly 4/5     Communication   Communication: No difficulties  Cognition Arousal/Alertness: Awake/alert Behavior During Therapy: WFL for tasks assessed/performed Overall Cognitive Status: Within Functional Limits for tasks assessed                      General Comments General comments (skin integrity, edema, etc.): L LE wrapped, hemovac, polar care    Exercises Total Joint Exercises Goniometric  ROM: L knee AAROM -4 to 70 degrees Other Exercises Other Exercises: B LE ankle pumps and QS x20 each; L LE therex: supine: SLR, hip abd slides and heel slides; seated: heel slides x10 each. Cues for proper technique.      Assessment/Plan    PT Assessment Patient needs continued PT services  PT Diagnosis Difficulty walking;Generalized weakness   PT Problem List Decreased strength;Decreased range of motion;Decreased activity tolerance;Decreased balance;Decreased mobility;Decreased knowledge of use of DME  PT Treatment Interventions Gait  training;Stair training;DME instruction;Therapeutic activities;Therapeutic exercise;Balance training;Neuromuscular re-education;Patient/family education   PT Goals (Current goals can be found in the Care Plan section) Acute Rehab PT Goals Patient Stated Goal: To walk better PT Goal Formulation: With patient Time For Goal Achievement: 10/13/15 Potential to Achieve Goals: Good    Frequency Min 2X/week   Barriers to discharge   none    Co-evaluation               End of Session Equipment Utilized During Treatment: Gait belt Activity Tolerance: Patient tolerated treatment well Patient left: in chair;with call bell/phone within reach;with chair alarm set;with SCD's reapplied Nurse Communication: Mobility status         Time: [id number] PT Time Calculation (min) (ACUTE ONLY): 32 min   Charges:   PT Evaluation $PT Eval Low Complexity: 1 Procedure PT Treatments $Therapeutic Exercise: 8-22 mins   PT G Codes:        Adelene IdlerMindy Jo Traeton Bordas, PT, DPT  09/29/2015, 9:50 AM [phone]

## 2015-09-29 NOTE — Care Management Note (Addendum)
Case Management Note  Patient Details  Name: Linda Ibarra MRN: [id number] Date of Birth: 1941-05-07  Subjective/Objective:                  Met with patient to discuss discharge planning. PT pending. Patient agrees to rehab at home or at a facility. She would like to use [facility] if she is able to return home with her husband. She has a rolling walker present at bedside. She uses [facility] [address] [phone]. She states her husband is able to assist her physically however she doesn't think he can give her Lovenox injections. RN will train patient and she is willing.  Action/Plan: List of home health agencies left with patient. Referral to [facility]. [facility]. RNCM will continue to follow.   Expected Discharge Date:  10/01/15               Expected Discharge Plan:     In-House Referral:     Discharge planning Services  CM Consult  Post Acute Care Choice:  Home Health Choice offered to:  Patient  DME Arranged:    DME Agency:     HH Arranged:  PT HH Agency:  [facility] Status of Service:  In process, will continue to follow  Medicare Important Message Given:    Date Medicare IM Given:    Medicare IM give by:    Date Additional Medicare IM Given:    Additional Medicare Important Message give by:     If discussed at [place], dates discussed:    Additional Comments: Lovenox $ 111.77. Patient notified.    AMarshell Garfinkel RN 09/29/2015, 9:14 AM

## 2015-09-29 NOTE — Progress Notes (Signed)
Pt. Alert and oriented. VSS. Pain controlled with meds per MAR. Nausea controlled with IV zofran. Neurochecks WDL. Pt. Dangled at the bedside last PM and did great with minimal pain. Pt. Instructed on IS and was encouraged to use it. Polarcare on and full of ice and water. Heels elevated off of bed with towel rolls. Pt. Resting quietly. Will continue to monitor.

## 2015-09-29 NOTE — Evaluation (Signed)
Occupational Therapy Evaluation Patient Details Name: Linda Ibarra MRN: [id number] DOB: Nov 10, 1940 Today's Date: 09/29/2015    History of Present Illness 75 yo F s/p elective L TKA. She is WBAT. Performed L SLR x10 no KI required.   Clinical Impression   Pt is 75 year old female s/p L TKA.  Pt was independent in all ADLs prior to surgery and using a sock aid as needed for socks since a prior back surgery.  She is eager to return to PLOF.  Pt currently requires min assist for LB dressing while in seated position at EOB due to limited AROM of L knee.  Pt would benefit from instruction in dressing techniques with or without assistive devices for dressing and bathing skills.  Pt would also benefit from recommendations for home modifications to increase safety in the bathroom and prevent falls. Will assess for OT [facility] needs as pt progresses in therapy, but most likely will not need any further OT after DC from hospital.    Follow Up Recommendations       Equipment Recommendations  None recommended by OT    Recommendations for Other Services       Precautions / Restrictions Precautions Precautions: Knee Restrictions Weight Bearing Restrictions: Yes LLE Weight Bearing: Weight bearing as tolerated      Mobility Bed Mobility                  Transfers                      Balance                                            ADL Overall ADL's : Needs assistance/impaired                                       General ADL Comments: Pt is familiar with use of reacher and sock aid and has both at home.  She was able to complete LB dressing skills sitting EOB with min assist and cues.  Pain was 0/10 lying and sitting.  She is able to complete feeding, grooming and UB dressing skills indep after set up.  She has a huband and several family meembers close by for any assist needed.  She has a built in shower seat in shower stall with a grab  bar .  Toilet set is "comfort height".       Vision     Perception     Praxis      Pertinent Vitals/Pain Pain Assessment: No/denies pain     Hand Dominance     Extremity/Trunk Assessment Upper Extremity Assessment Upper Extremity Assessment: Overall WFL for tasks assessed   Lower Extremity Assessment Lower Extremity Assessment: Defer to PT evaluation       Communication Communication Communication: No difficulties   Cognition Arousal/Alertness: Awake/alert Behavior During Therapy: WFL for tasks assessed/performed Overall Cognitive Status: Within Functional Limits for tasks assessed                     General Comments       Exercises       Shoulder Instructions      Home Living Family/patient expects to be discharged to:: Private residence Living Arrangements:  Spouse/significant other Available Help at Discharge: Family Type of Home: House Home Access: Stairs to enter Entergy CorporationEntrance Stairs-Number of Steps: 1 Entrance Stairs-Rails: None Home Layout: One level     Bathroom Shower/Tub: Producer, television/film/videoWalk-in shower   Bathroom Toilet: Handicapped height Bathroom Accessibility: Yes How Accessible: Accessible via walker Home Equipment: Walker - 2 wheels;Cane - single point;Shower seat - built Designer, fashion/clothingin;Adaptive equipment Adaptive Equipment: Reacher;Sock aid        Prior Functioning/Environment Level of Independence: Independent with assistive device(s)        Comments: Ambulated with cane.    OT Diagnosis: Generalized weakness;Acute pain (increased pain from 0-2/10 when ambulating)   OT Problem List: Decreased activity tolerance;Decreased knowledge of use of DME or AE;Decreased range of motion;Decreased strength   OT Treatment/Interventions: Self-care/ADL training;DME and/or AE instruction;Patient/family education    OT Goals(Current goals can be found in the care plan section) Acute Rehab OT Goals Patient Stated Goal: "to go home if someone can do my shots" OT  Goal Formulation: With patient/family Time For Goal Achievement: 10/13/15 Potential to Achieve Goals: Good ADL Goals Pt Will Perform Lower Body Dressing: Independently;with adaptive equipment;sit to/from stand (with no LOB sitting EOB or in chair) Pt Will Transfer to Toilet: with supervision;stand pivot transfer (BSC over toilet since she has a higher toilet at home) Pt Will Perform Toileting - Clothing Manipulation and hygiene: Independently;sit to/from stand  OT Frequency: Min 1X/week   Barriers to D/C:            Co-evaluation              End of Session Equipment Utilized During Treatment:  (reacher and sock aid)  Activity Tolerance: Patient tolerated treatment well Patient left: in bed;with call bell/phone within reach;with bed alarm set;with family/visitor present;with SCD's reapplied   Time: 1330-1400 OT Time Calculation (min): 30 min Charges:  OT General Charges $OT Visit: 1 Procedure OT Evaluation $OT Eval Low Complexity: 1 Procedure OT Treatments $Self Care/Home Management : 8-22 mins G-Codes:     Susanne BordersSusan Meshach Perry, OTR/L ascom [phone] 09/29/2015, 3:17 PM

## 2015-09-29 NOTE — Progress Notes (Signed)
Foley d/c'd at 0555 with 350cc urine output.

## 2015-09-29 NOTE — Progress Notes (Signed)
Physical Therapy Treatment Patient Details Name: Linda Ibarra MRN: [id number] DOB: March 10, 1941 Today's Date: 09/29/2015    History of Present Illness 75 yo F s/p elective L TKA. She is WBAT. Performed L SLR x10 no KI required.    PT Comments    Pt demonstrated progression towards functional goals by increasing ambulation distance to 200 ft with improved mechanics. She requires min A for transfers and supervision for ambulation with cues for technique. Pain continues to be well managed. She will benefit from continued skilled PT to increase functional I and mobility to return to PLOF.   Follow Up Recommendations  Home health PT     Equipment Recommendations  None recommended by PT    Recommendations for Other Services       Precautions / Restrictions Precautions Precautions: Knee Restrictions Weight Bearing Restrictions: Yes LLE Weight Bearing: Weight bearing as tolerated    Mobility  Bed Mobility Overal bed mobility: Modified Independent             General bed mobility comments: uses rail  Transfers Overall transfer level: Needs assistance Equipment used: Rolling walker (2 wheeled) Transfers: Sit to/from Stand;Stand Pivot Transfers Sit to Stand: Min guard Stand pivot transfers: Min guard       General transfer comment: cues for hand placement  Ambulation/Gait Ambulation/Gait assistance: Supervision Ambulation Distance (Feet): 200 Feet Assistive device: Rolling walker (2 wheeled) Gait Pattern/deviations: Step-to pattern;Decreased stride length;Step-through pattern;Narrow base of support     General Gait Details: slow but steady gait with no LOB; improved with step through pattern and increased step length   Stairs            Wheelchair Mobility    Modified Rankin (Stroke Patients Only)       Balance Overall balance assessment: Needs assistance Sitting-balance support: No upper extremity supported Sitting balance-Leahy Scale: Good      Standing balance support: Single extremity supported Standing balance-Leahy Scale: Fair Standing balance comment: steady with no LOB                    Cognition Arousal/Alertness: Awake/alert Behavior During Therapy: WFL for tasks assessed/performed Overall Cognitive Status: Within Functional Limits for tasks assessed                      Exercises Other Exercises Other Exercises: B LE ankle pumps, GS, and QS x20 each; L LE therex: supine: SLR, hip abd slides and heel slides; seated: LAQs x10 each. Cues for proper technique. Other Exercises: Pt ambulated 200 ft with FWW and supervision with steady gait. She was able to improve step length/step through pattern.    General Comments        Pertinent Vitals/Pain Pain Assessment: No/denies pain    Home Living Family/patient expects to be discharged to:: Private residence Living Arrangements: Spouse/significant other Available Help at Discharge: Family Type of Home: House Home Access: Stairs to enter Entrance Stairs-Rails: None Home Layout: One level Home Equipment: Environmental consultantWalker - 2 wheels;Cane - single point;Shower seat - built in;Adaptive equipment      Prior Function Level of Independence: Independent with assistive device(s)      Comments: Ambulated with cane.   PT Goals (current goals can now be found in the care plan section) Acute Rehab PT Goals Patient Stated Goal: "to go home if someone can do my shots" PT Goal Formulation: With patient Time For Goal Achievement: 10/13/15 Potential to Achieve Goals: Good Progress towards PT goals: Progressing toward  goals    Frequency  Min 2X/week    PT Plan Current plan remains appropriate    Co-evaluation             End of Session Equipment Utilized During Treatment: Gait belt Activity Tolerance: Patient tolerated treatment well Patient left: in bed;with call bell/phone within reach;with bed alarm set;with SCD's reapplied     Time: 1610-9604 PT Time  Calculation (min) (ACUTE ONLY): 28 min  Charges:  $Gait Training: 8-22 mins $Therapeutic Exercise: 8-22 mins                    G Codes:      Adelene Idler, PT, DPT  09/29/2015, 3:56 PM [phone]

## 2015-09-29 NOTE — Anesthesia Postprocedure Evaluation (Signed)
Anesthesia Post Note  Patient: Linda Ibarra  Procedure(s) Performed: Procedure(s) (LRB): COMPUTER ASSISTED TOTAL KNEE ARTHROPLASTY (Left)  Patient location during evaluation: Nursing Unit Anesthesia Type: General Level of consciousness: awake, awake and alert, oriented and patient cooperative Pain management: pain level controlled Vital Signs Assessment: post-procedure vital signs reviewed and stable Respiratory status: spontaneous breathing, nonlabored ventilation and respiratory function stable Cardiovascular status: blood pressure returned to baseline and stable Postop Assessment: no backache, no headache, patient able to bend at knees and no signs of nausea or vomiting Anesthetic complications: no    Last Vitals:  Filed Vitals:   09/29/15 0103 09/29/15 0358  BP: 116/63 108/67  Pulse: 70 69  Temp: 36.3 C 36.6 C  Resp: 18 18    Last Pain:  Filed Vitals:   09/29/15 0647  PainSc: Asleep                 Ginger CarneStephanie Natnael Biederman

## 2015-09-29 NOTE — Progress Notes (Signed)
Clinical Social Worker (CSW) received SNF consult. PT is recommending home health. RN Case Manager is aware of above. Please reconsult if future social work needs arise. CSW signing off.   Makayli Bracken Morgan, LCSW [phone] 

## 2015-09-29 NOTE — Progress Notes (Signed)
ORTHOPAEDICS PROGRESS NOTE  PATIENT NAME: Linda Ibarra DOB: 1941-05-04  MRN: [id number]  POD # 1: Left total knee arthroplasty  Subjective: The patient rested well last night. She denies any significant pain or nausea this morning.  Objective: Vital signs in last 24 hours: Temp:  [97 F (36.1 C)-98.1 F (36.7 C)] 97.8 F (36.6 C) (03/23 0358) Pulse Rate:  [47-89] 69 (03/23 0358) Resp:  [15-23] 18 (03/23 0358) BP: (108-172)/(63-99) 108/67 mmHg (03/23 0358) SpO2:  [94 %-100 %] 100 % (03/23 0358) Weight:  [84.823 kg (187 lb)] 84.823 kg (187 lb) (03/22 0930)  Intake/Output from previous day: 03/22 0701 - 03/23 0700 In: 2506.7 [I.V.:1896.7; IV Piggyback:610] Out: 1050 [Urine:910; Drains:90; Blood:50]   Recent Labs  09/28/15 1008 09/29/15 0609  WBC  --  12.2*  HGB 12.2 9.4*  HCT 36.0 28.3*  PLT  --  234  K 3.5 3.7  CL  --  108  CO2  --  25  BUN  --  16  CREATININE  --  1.43*  GLUCOSE 106* 120*  CALCIUM  --  7.9*    EXAM General: Awake, alert, and oriented. Lungs: clear to auscultation Cardiac: normal rate, regular rhythm, normal S1, S2, no murmurs, rubs, clicks or gallops Left lower extremity: Dressing is dry and intact. The patient is able to perform an independent straight leg raise. Homans test is negative. Neurologic: Sensory and motor function is grossly intact.  Assessment: Left total knee arthroplasty.  Active Problems:   S/P total knee arthroplasty   Plan: Today's goal were reviewed with the patient.  Begin physical therapy and occupational therapy as per total knee arthroplasty rehabilitation protocol. Plan is to go Skilled nursing facility after hospital stay (pending physical therapy evaluation). DVT Prophylaxis - Lovenox, Foot Pumps and TED hose Repeat labs in the morning.  Rembert Browe P. Angie FavaHooten, Jr. M.D.

## 2015-09-30 LAB — BASIC METABOLIC PANEL WITH GFR
Anion gap: 4 — ABNORMAL LOW (ref 5–15)
BUN: 14 mg/dL (ref 6–20)
CO2: 25 mmol/L (ref 22–32)
Calcium: 8.1 mg/dL — ABNORMAL LOW (ref 8.9–10.3)
Chloride: 109 mmol/L (ref 101–111)
Creatinine, Ser: 1.39 mg/dL — ABNORMAL HIGH (ref 0.44–1.00)
GFR calc Af Amer: 42 mL/min — ABNORMAL LOW
GFR calc non Af Amer: 36 mL/min — ABNORMAL LOW
Glucose, Bld: 125 mg/dL — ABNORMAL HIGH (ref 65–99)
Potassium: 3.3 mmol/L — ABNORMAL LOW (ref 3.5–5.1)
Sodium: 138 mmol/L (ref 135–145)

## 2015-09-30 LAB — CBC
HCT: 28.9 % — ABNORMAL LOW (ref 35.0–47.0)
Hemoglobin: 9.7 g/dL — ABNORMAL LOW (ref 12.0–16.0)
MCH: 28 pg (ref 26.0–34.0)
MCHC: 33.7 g/dL (ref 32.0–36.0)
MCV: 83 fL (ref 80.0–100.0)
Platelets: 216 10*3/uL (ref 150–440)
RBC: 3.48 MIL/uL — ABNORMAL LOW (ref 3.80–5.20)
RDW: 16 % — ABNORMAL HIGH (ref 11.5–14.5)
WBC: 9.8 10*3/uL (ref 3.6–11.0)

## 2015-09-30 MED ORDER — POTASSIUM CHLORIDE 20 MEQ PO PACK
20.0000 meq | PACK | Freq: Two times a day (BID) | ORAL | Status: DC
  Filled 2015-09-30: qty 1

## 2015-09-30 MED ORDER — POTASSIUM CHLORIDE CRYS ER 20 MEQ PO TBCR
20.0000 meq | EXTENDED_RELEASE_TABLET | Freq: Two times a day (BID) | ORAL | Status: DC
  Administered 2015-09-30: 20 meq via ORAL
  Filled 2015-09-30: qty 1

## 2015-09-30 MED ORDER — TRAMADOL HCL 50 MG PO TABS: 50.0000 mg | ORAL_TABLET | ORAL | Status: DC | PRN

## 2015-09-30 MED ORDER — ENOXAPARIN SODIUM 40 MG/0.4ML ~~LOC~~ SOLN: 40.0000 mg | SUBCUTANEOUS | Status: DC

## 2015-09-30 MED ORDER — OXYCODONE HCL 5 MG PO TABS: 5.0000 mg | ORAL_TABLET | ORAL | Status: DC | PRN

## 2015-09-30 NOTE — Care Management Important Message (Signed)
Important Message  Patient Details  Name: Linda Ibarra MRN: [id number] Date of Birth: September 22, 1940   Medicare Important Message Given:  Yes    Olegario MessierKathy A Jerricka Carvey 09/30/2015, 10:45 AM

## 2015-09-30 NOTE — Progress Notes (Signed)
Physical Therapy Treatment Patient Details Name: Linda Ibarra MRN: [id number] DOB: [birth year] Today's Date: 09/30/2015    History of Present Illness 75 yo F s/p elective L TKA. She is WBAT. Performed L SLR x10 no KI required.    PT Comments    Pt's ROM continues to improve and is now -2 to 85 degrees for L knee. Ambulation was planned and attempted, but after a very short distance pt c/o dizziness and requests to return to bed. Seated BP 142/75 and HR 90. RN notified. Pt was able to tolerate L LE therex. She c/o increased pain today of 6/10. Session limited by dizziness and pain. Ambulation will be attempted later today if pt is appropriate.  Follow Up Recommendations  Home health PT     Equipment Recommendations  None recommended by PT    Recommendations for Other Services       Precautions / Restrictions Precautions Precautions: Knee Restrictions Weight Bearing Restrictions: Yes LLE Weight Bearing: Weight bearing as tolerated    Mobility  Bed Mobility Overal bed mobility: Modified Independent             General bed mobility comments: uses rail  Transfers Overall transfer level: Needs assistance Equipment used: Rolling walker (2 wheeled) Transfers: Sit to/from Stand;Stand Pivot Transfers Sit to Stand: Supervision Stand pivot transfers: Supervision       General transfer comment: cues for hand placement  Ambulation/Gait Ambulation/Gait assistance: Supervision Ambulation Distance (Feet): 10 Feet Assistive device: Rolling walker (2 wheeled) Gait Pattern/deviations: Step-to pattern         Stairs            Wheelchair Mobility    Modified Rankin (Stroke Patients Only)       Balance Overall balance assessment: Modified Independent Sitting-balance support: No upper extremity supported Sitting balance-Leahy Scale: Good     Standing balance support: No upper extremity supported Standing balance-Leahy Scale: Good Standing balance comment:  steady with no LOB                    Cognition Arousal/Alertness: Awake/alert Behavior During Therapy: WFL for tasks assessed/performed Overall Cognitive Status: Within Functional Limits for tasks assessed                      Exercises Total Joint Exercises Goniometric ROM: -2 to 85 Other Exercises Other Exercises: L LE ankle pumps, GS, and QS x20 each; L LE therex: supine: SLR, hip abd slides; seated: LAQs, heel slides x10 each. Cues for proper technique. Other Exercises: Attempted gait (~10 ft) and pt c/o dizziness and needed to return to bed. Further ambulation unable to be performed at this time. Seated BP 142/75. RN notified.    General Comments General comments (skin integrity, edema, etc.): hemovac removed, L knee swelling      Pertinent Vitals/Pain Pain Assessment: 0-10 Pain Score: 6  Pain Location: L knee Pain Descriptors / Indicators: Aching Pain Intervention(s): Limited activity within patient's tolerance;Monitored during session;Premedicated before session;Repositioned    Home Living                      Prior Function            PT Goals (current goals can now be found in the care plan section) Acute Rehab PT Goals Patient Stated Goal: "to go home if someone can do my shots" PT Goal Formulation: With patient Time For Goal Achievement: 10/13/15 Potential to Achieve Goals: Good Progress  towards PT goals: Progressing toward goals    Frequency  BID (was meant to be BID in evaluation note)    PT Plan Frequency needs to be updated    Co-evaluation             End of Session Equipment Utilized During Treatment: Gait belt Activity Tolerance: Patient limited by pain;Patient limited by fatigue Patient left: in bed;with call bell/phone within reach;with bed alarm set;with SCD's reapplied     Time: [id number] PT Time Calculation (min) (ACUTE ONLY): 25 min  Charges:  $Gait Training: 8-22 mins $Therapeutic Exercise: 8-22  mins                    G Codes:      Adelene IdlerMindy Jo Jad Johansson, PT, DPT  09/30/2015, 10:05 AM [phone]

## 2015-09-30 NOTE — Progress Notes (Addendum)
   Subjective: 2 Days Post-Op Procedure(s) (LRB): COMPUTER ASSISTED TOTAL KNEE ARTHROPLASTY (Left) Patient reports pain as mild.   Patient is well, and has had no acute complaints or problems Denies any CP, SOB, ABD pain. We will continue therapy today.  Plan is to go Home after hospital stay.  Objective: Vital signs in last 24 hours: Temp:  [97.8 F (36.6 C)-97.9 F (36.6 C)] 97.9 F (36.6 C) (03/24 0410) Pulse Rate:  [62-82] 82 (03/24 0410) Resp:  [18-20] 18 (03/24 0410) BP: (105-137)/(59-68) 123/65 mmHg (03/24 0410) SpO2:  [95 %-99 %] 95 % (03/24 0410)  Intake/Output from previous day: 03/23 0701 - 03/24 0700 In: 2563.3 [P.O.:600; I.V.:1963.3] Out: 550 [Urine:400; Drains:150] Intake/Output this shift:     Recent Labs  09/28/15 1008 09/29/15 0609 09/30/15 0542  HGB 12.2 9.4* 9.7*    Recent Labs  09/29/15 0609 09/30/15 0542  WBC 12.2* 9.8  RBC 3.38* 3.48*  HCT 28.3* 28.9*  PLT 234 216    Recent Labs  09/29/15 0609 09/30/15 0542  NA 136 138  K 3.7 3.3*  CL 108 109  CO2 25 25  BUN 16 14  CREATININE 1.43* 1.39*  GLUCOSE 120* 125*  CALCIUM 7.9* 8.1*   No results for input(s): LABPT, INR in the last 72 hours.  EXAM General - Patient is Alert, Appropriate and Oriented Extremity - Neurovascular intact Sensation intact distally Intact pulses distally Dorsiflexion/Plantar flexion intact Dressing - dressing C/D/I, no drainage and hemovac removed Motor Function - intact, moving foot and toes well on exam.   Past Medical History  Diagnosis Date  . Hypertension   . H/O seasonal allergies   . Arthritis   . Anemia     Assessment/Plan:   2 Days Post-Op Procedure(s) (LRB): COMPUTER ASSISTED TOTAL KNEE ARTHROPLASTY (Left) Active Problems:   S/P total knee arthroplasty  Estimated body mass index is 39.09 kg/(m^2) as calculated from the following:   Height as of this encounter: 4\' 10"  (1.473 m).   Weight as of this encounter: 84.823 kg (187  lb). Advance diet Up with therapy  Klor kon 20 meq bid today Plan on discharge to home with HHPT today pending BM  DVT Prophylaxis - Lovenox, Foot Pumps and TED hose Weight-Bearing as tolerated to left leg D/C O2 and Pulse OX and try on Room Air  T. Cranston Neighborhris Gaines, PA-C [facility] 09/30/2015, 8:13 AM

## 2015-09-30 NOTE — Care Management (Signed)
Patient discharging to home today. [facility] have [facility]. No further RNCM needs. Case closed.

## 2015-09-30 NOTE — Progress Notes (Signed)
DISCHARGE NOTE:  Pt given discharge instructions and prescriptions. Pt verbalized understanding. Pt waiting on her husband for transportation.

## 2015-09-30 NOTE — Progress Notes (Signed)
Pt was able to give her own lovenox injection.  Secretary/administratorducation material given

## 2015-09-30 NOTE — Progress Notes (Signed)
Physical Therapy Treatment Patient Details Name: Linda Ibarra MRN: [id number] DOB: 1940/08/02 Today's Date: 09/30/2015    History of Present Illness 75 yo F s/p elective L TKA. She is WBAT. Performed L SLR x10 no KI required.    PT Comments    Pt feeling much better this afternoon. She continues to improve in functional mobility. Pt ambulated 220 ft with supervision and FWW. She navigated 1 step with min guard and FWW with cues for sequence. Pt demonstrates steady gait with no LOB. She will benefit from continued skilled PT to improve ROM, strength and functional mobility to return to PLOF.  Follow Up Recommendations  Home health PT     Equipment Recommendations  None recommended by PT    Recommendations for Other Services       Precautions / Restrictions Precautions Precautions: Knee Restrictions LLE Weight Bearing: Weight bearing as tolerated    Mobility  Bed Mobility Overal bed mobility: Modified Independent             General bed mobility comments: uses rail  Transfers Overall transfer level: Needs assistance Equipment used: Rolling walker (2 wheeled) Transfers: Sit to/from Stand;Stand Pivot Transfers Sit to Stand: Supervision Stand pivot transfers: Supervision       General transfer comment: cues for hand placement  Ambulation/Gait Ambulation/Gait assistance: Supervision Ambulation Distance (Feet): 220 Feet Assistive device: Rolling walker (2 wheeled) Gait Pattern/deviations: Decreased stride length;Antalgic     General Gait Details: slow but steady gait with no LOB; improved with step through pattern and increased step length   Stairs Stairs: Yes Stairs assistance: Min guard Stair Management: With walker Number of Stairs: 1 General stair comments: steady with no LOB, cues for sequence  Wheelchair Mobility    Modified Rankin (Stroke Patients Only)       Balance Overall balance assessment: Modified Independent Sitting-balance  support: No upper extremity supported Sitting balance-Leahy Scale: Good     Standing balance support: No upper extremity supported Standing balance-Leahy Scale: Good Standing balance comment: steady with no LOB                    Cognition Arousal/Alertness: Awake/alert Behavior During Therapy: WFL for tasks assessed/performed Overall Cognitive Status: Within Functional Limits for tasks assessed                      Exercises      General Comments        Pertinent Vitals/Pain Pain Assessment: 0-10 Pain Score: 4  Pain Location: L knee Pain Descriptors / Indicators: Aching Pain Intervention(s): Limited activity within patient's tolerance;Monitored during session    Home Living                      Prior Function            PT Goals (current goals can now be found in the care plan section) Acute Rehab PT Goals PT Goal Formulation: With patient Time For Goal Achievement: 10/13/15 Potential to Achieve Goals: Good Progress towards PT goals: Progressing toward goals    Frequency  BID    PT Plan Current plan remains appropriate    Co-evaluation             End of Session Equipment Utilized During Treatment: Gait belt Activity Tolerance: Patient tolerated treatment well Patient left: Other (comment) (BSC with nursing supervision and call bell)     Time: [id number] PT Time Calculation (min) (ACUTE ONLY): 24 min  Charges:  $Gait Training: 8-22 mins $Therapeutic Activity: 8-22 mins                    G Codes:      Adelene Idler, PT, DPT  09/30/2015, 1:55 PM [phone]

## 2015-09-30 NOTE — Discharge Summary (Signed)
Physician Discharge Summary  Patient ID: Linda Ibarra MRN: [id number] DOB/AGE: [birth year] 75 y.o.  Admit date: 09/28/2015 Discharge date: 09/30/2015  Admission Diagnoses:  PRIMARY OSTEOARTHRITIS   Discharge Diagnoses: Patient Active Problem List   Diagnosis Date Noted  . 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 Hypertension   . H/O seasonal allergies   . Arthritis   . Anemia      Transfusion: none   Consultants (if any):    Discharged Condition: Improved  Hospital Course: Linda Ibarra is an 75 y.o. female who was admitted 09/28/2015 with a diagnosis of left knee Osteoarthritis and went to the operating room on 09/28/2015 and underwent the above named procedures.    Surgeries: Procedure(s): COMPUTER ASSISTED TOTAL KNEE ARTHROPLASTY on 09/28/2015 Patient tolerated the surgery well. Taken to PACU where she was stabilized and then transferred to the orthopedic floor.  Started on Lovenox 30 q 12 hrs. Foot pumps applied bilaterally at 80 mm. Heels elevated on bed with rolled towels. No evidence of DVT. Negative Homan. Physical therapy started on day #1 for gait training and transfer. OT started day #1 for ADL and assisted devices.  Patient's foley was d/c on day #1. Patient's IV and hemovac was d/c on day #2.  On post op day #2 patient was stable and ready for discharge to home with HHPT.  Implants: DePuy PFC Sigma size 2.5 posterior stabilized femoral component (cemented), size 2.5 MBT tibial component (cemented), 35 mm 3 peg oval dome patella (cemented), and a 15 mm stabilized rotating platform polyethylene insert.   She was given perioperative antibiotics:  Anti-infectives    Start     Dose/Rate Route Frequency Ordered Stop   09/28/15 1815  ceFAZolin (ANCEF) IVPB 2g/100 mL premix     2 g over 30 Minutes Intravenous Every 6 hours 09/28/15 1814 09/29/15 1338   09/28/15 0930  dextrose 5 % 50 mL with ceFAZolin (ANCEF) 2 g  infusion     140 mL/hr  Intravenous  Once 09/28/15 0925 09/28/15 1234   09/28/15 0915  ceFAZolin (ANCEF) IVPB 2g/100 mL premix  Status:  Discontinued     2 g over 30 Minutes Intravenous  Once 09/28/15 0913 09/28/15 0924   09/28/15 0759  ceFAZolin (ANCEF) 2-3 GM-% IVPB SOLR    Comments:  Letta Pate: cabinet override      09/28/15 0759 09/28/15 1248    .  She was given sequential compression devices, early ambulation, and lovenox for DVT prophylaxis.  She benefited maximally from the hospital stay and there were no complications.    Recent vital signs:  Filed Vitals:   09/30/15 0410 09/30/15 0815  BP: 123/65 129/82  Pulse: 82 86  Temp: 97.9 F (36.6 C) 97.9 F (36.6 C)  Resp: 18 18    Recent laboratory studies:  Lab Results  Component Value Date   HGB 9.7* 09/30/2015   HGB 9.4* 09/29/2015   HGB 12.2 09/28/2015   Lab Results  Component Value Date   WBC 9.8 09/30/2015   PLT 216 09/30/2015   Lab Results  Component Value Date   INR 1.13 09/14/2015   Lab Results  Component Value Date   NA 138 09/30/2015   K 3.3* 09/30/2015   CL 109 09/30/2015   CO2 25 09/30/2015   BUN 14 09/30/2015   CREATININE 1.39* 09/30/2015   GLUCOSE 125* 09/30/2015    Discharge Medications:     Medication List  TAKE these medications        aspirin EC 81 MG tablet  Take 81 mg by mouth daily.     Calcium Citrate-Vitamin D 315-250 MG-UNIT Tabs  Take 1 tablet by mouth daily.     CENTRUM ADULTS Tabs  Take 1 tablet by mouth daily.     dorzolamide-timolol 22.3-6.8 MG/ML ophthalmic solution  Commonly known as:  COSOPT  Place 1 drop into both eyes 2 (two) times daily.     enoxaparin 40 MG/0.4ML injection  Commonly known as:  LOVENOX  Inject 0.4 mLs (40 mg total) into the skin daily.     hydrochlorothiazide 25 MG tablet  Commonly known as:  HYDRODIURIL  Take 12.5 mg by mouth daily.     latanoprost 0.005 % ophthalmic solution  Commonly known as:  XALATAN  Place 1 drop into  both eyes at bedtime.     oxybutynin 5 MG tablet  Commonly known as:  DITROPAN  Take 5 mg by mouth daily.     oxyCODONE 5 MG immediate release tablet  Commonly known as:  Oxy IR/ROXICODONE  Take 1-2 tablets (5-10 mg total) by mouth every 4 (four) hours as needed for severe pain or breakthrough pain.     potassium chloride SA 20 MEQ tablet  Commonly known as:  K-DUR,KLOR-CON  Take 20 mEq by mouth daily.     traMADol 50 MG tablet  Commonly known as:  ULTRAM  Take 1-2 tablets (50-100 mg total) by mouth every 4 (four) hours as needed for moderate pain.        Diagnostic Studies: Dg Knee Left Port  09/28/2015  CLINICAL DATA:  Status post left knee replacement EXAM: PORTABLE LEFT KNEE - 1-2 VIEW COMPARISON:  None. FINDINGS: Left knee replacement is noted. Surgical drains are seen. No acute abnormality noted. IMPRESSION: Status post left knee replacement Electronically Signed   By: Alcide CleverMark  Lukens M.D.   On: 09/28/2015 16:18    Disposition: Home with HHPT        Follow-up Information    [facility]2015.   Specialty:  Physician Assistant   Why:  at 10:15am   Contact information:   [address] [facility] [place] [address] [phone]       Follow up with Donato HeinzHOOTEN,JAMES P, MD On 11/10/2015.   Specialty:  Orthopedic Surgery   Why:  at 9:45am   Contact information:   [address] [facility] [place] [id number] [phone]        Signed: Patience MuscaGAINES, Rainey Rodger CHRISTOPHER 09/30/2015, 8:19 AM

## 2015-09-30 NOTE — Progress Notes (Signed)
Pt prefers PO KLOR-Con vs. powder.  Cranston Neighborhris Gaines notified. Orders received for PO KLOR-CON 20 mEq.

## 2015-09-30 NOTE — Discharge Instructions (Signed)

## 2015-10-03 DIAGNOSIS — E669 Obesity, unspecified: Secondary | ICD-10-CM | POA: Diagnosis not present

## 2015-10-03 DIAGNOSIS — I10 Essential (primary) hypertension: Secondary | ICD-10-CM | POA: Diagnosis not present

## 2015-10-03 DIAGNOSIS — M4806 Spinal stenosis, lumbar region: Secondary | ICD-10-CM | POA: Diagnosis not present

## 2015-10-03 DIAGNOSIS — M1990 Unspecified osteoarthritis, unspecified site: Secondary | ICD-10-CM | POA: Diagnosis not present

## 2015-10-03 DIAGNOSIS — M5136 Other intervertebral disc degeneration, lumbar region: Secondary | ICD-10-CM | POA: Diagnosis not present

## 2015-10-03 DIAGNOSIS — Z471 Aftercare following joint replacement surgery: Secondary | ICD-10-CM | POA: Diagnosis not present

## 2015-10-04 DIAGNOSIS — I10 Essential (primary) hypertension: Secondary | ICD-10-CM | POA: Diagnosis not present

## 2015-10-04 DIAGNOSIS — M5136 Other intervertebral disc degeneration, lumbar region: Secondary | ICD-10-CM | POA: Diagnosis not present

## 2015-10-04 DIAGNOSIS — M1990 Unspecified osteoarthritis, unspecified site: Secondary | ICD-10-CM | POA: Diagnosis not present

## 2015-10-04 DIAGNOSIS — E669 Obesity, unspecified: Secondary | ICD-10-CM | POA: Diagnosis not present

## 2015-10-04 DIAGNOSIS — Z471 Aftercare following joint replacement surgery: Secondary | ICD-10-CM | POA: Diagnosis not present

## 2015-10-04 DIAGNOSIS — M4806 Spinal stenosis, lumbar region: Secondary | ICD-10-CM | POA: Diagnosis not present

## 2015-10-05 DIAGNOSIS — M4806 Spinal stenosis, lumbar region: Secondary | ICD-10-CM | POA: Diagnosis not present

## 2015-10-05 DIAGNOSIS — M5136 Other intervertebral disc degeneration, lumbar region: Secondary | ICD-10-CM | POA: Diagnosis not present

## 2015-10-05 DIAGNOSIS — I10 Essential (primary) hypertension: Secondary | ICD-10-CM | POA: Diagnosis not present

## 2015-10-05 DIAGNOSIS — E669 Obesity, unspecified: Secondary | ICD-10-CM | POA: Diagnosis not present

## 2015-10-05 DIAGNOSIS — Z471 Aftercare following joint replacement surgery: Secondary | ICD-10-CM | POA: Diagnosis not present

## 2015-10-05 DIAGNOSIS — M1990 Unspecified osteoarthritis, unspecified site: Secondary | ICD-10-CM | POA: Diagnosis not present

## 2015-10-06 DIAGNOSIS — E669 Obesity, unspecified: Secondary | ICD-10-CM | POA: Diagnosis not present

## 2015-10-06 DIAGNOSIS — M5136 Other intervertebral disc degeneration, lumbar region: Secondary | ICD-10-CM | POA: Diagnosis not present

## 2015-10-06 DIAGNOSIS — M1990 Unspecified osteoarthritis, unspecified site: Secondary | ICD-10-CM | POA: Diagnosis not present

## 2015-10-06 DIAGNOSIS — M4806 Spinal stenosis, lumbar region: Secondary | ICD-10-CM | POA: Diagnosis not present

## 2015-10-06 DIAGNOSIS — I10 Essential (primary) hypertension: Secondary | ICD-10-CM | POA: Diagnosis not present

## 2015-10-06 DIAGNOSIS — Z471 Aftercare following joint replacement surgery: Secondary | ICD-10-CM | POA: Diagnosis not present

## 2015-10-07 DIAGNOSIS — E669 Obesity, unspecified: Secondary | ICD-10-CM | POA: Diagnosis not present

## 2015-10-07 DIAGNOSIS — M1990 Unspecified osteoarthritis, unspecified site: Secondary | ICD-10-CM | POA: Diagnosis not present

## 2015-10-07 DIAGNOSIS — M5136 Other intervertebral disc degeneration, lumbar region: Secondary | ICD-10-CM | POA: Diagnosis not present

## 2015-10-07 DIAGNOSIS — I10 Essential (primary) hypertension: Secondary | ICD-10-CM | POA: Diagnosis not present

## 2015-10-07 DIAGNOSIS — Z471 Aftercare following joint replacement surgery: Secondary | ICD-10-CM | POA: Diagnosis not present

## 2015-10-07 DIAGNOSIS — M4806 Spinal stenosis, lumbar region: Secondary | ICD-10-CM | POA: Diagnosis not present

## 2015-10-10 DIAGNOSIS — M4806 Spinal stenosis, lumbar region: Secondary | ICD-10-CM | POA: Diagnosis not present

## 2015-10-10 DIAGNOSIS — M1990 Unspecified osteoarthritis, unspecified site: Secondary | ICD-10-CM | POA: Diagnosis not present

## 2015-10-10 DIAGNOSIS — Z471 Aftercare following joint replacement surgery: Secondary | ICD-10-CM | POA: Diagnosis not present

## 2015-10-10 DIAGNOSIS — E669 Obesity, unspecified: Secondary | ICD-10-CM | POA: Diagnosis not present

## 2015-10-10 DIAGNOSIS — I10 Essential (primary) hypertension: Secondary | ICD-10-CM | POA: Diagnosis not present

## 2015-10-10 DIAGNOSIS — M5136 Other intervertebral disc degeneration, lumbar region: Secondary | ICD-10-CM | POA: Diagnosis not present

## 2015-10-11 DIAGNOSIS — I10 Essential (primary) hypertension: Secondary | ICD-10-CM | POA: Diagnosis not present

## 2015-10-11 DIAGNOSIS — Z471 Aftercare following joint replacement surgery: Secondary | ICD-10-CM | POA: Diagnosis not present

## 2015-10-11 DIAGNOSIS — E669 Obesity, unspecified: Secondary | ICD-10-CM | POA: Diagnosis not present

## 2015-10-11 DIAGNOSIS — M4806 Spinal stenosis, lumbar region: Secondary | ICD-10-CM | POA: Diagnosis not present

## 2015-10-11 DIAGNOSIS — M5136 Other intervertebral disc degeneration, lumbar region: Secondary | ICD-10-CM | POA: Diagnosis not present

## 2015-10-11 DIAGNOSIS — M1990 Unspecified osteoarthritis, unspecified site: Secondary | ICD-10-CM | POA: Diagnosis not present

## 2015-10-12 DIAGNOSIS — M4806 Spinal stenosis, lumbar region: Secondary | ICD-10-CM | POA: Diagnosis not present

## 2015-10-12 DIAGNOSIS — I10 Essential (primary) hypertension: Secondary | ICD-10-CM | POA: Diagnosis not present

## 2015-10-12 DIAGNOSIS — M5136 Other intervertebral disc degeneration, lumbar region: Secondary | ICD-10-CM | POA: Diagnosis not present

## 2015-10-12 DIAGNOSIS — Z471 Aftercare following joint replacement surgery: Secondary | ICD-10-CM | POA: Diagnosis not present

## 2015-10-12 DIAGNOSIS — E669 Obesity, unspecified: Secondary | ICD-10-CM | POA: Diagnosis not present

## 2015-10-12 DIAGNOSIS — M1990 Unspecified osteoarthritis, unspecified site: Secondary | ICD-10-CM | POA: Diagnosis not present

## 2015-10-13 DIAGNOSIS — Z96652 Presence of left artificial knee joint: Secondary | ICD-10-CM | POA: Diagnosis not present

## 2015-10-17 DIAGNOSIS — G8929 Other chronic pain: Secondary | ICD-10-CM | POA: Diagnosis not present

## 2015-10-17 DIAGNOSIS — M25562 Pain in left knee: Secondary | ICD-10-CM | POA: Diagnosis not present

## 2015-10-19 DIAGNOSIS — G8929 Other chronic pain: Secondary | ICD-10-CM | POA: Diagnosis not present

## 2015-10-19 DIAGNOSIS — M25562 Pain in left knee: Secondary | ICD-10-CM | POA: Diagnosis not present

## 2015-10-26 DIAGNOSIS — M25562 Pain in left knee: Secondary | ICD-10-CM | POA: Diagnosis not present

## 2015-10-26 DIAGNOSIS — G8929 Other chronic pain: Secondary | ICD-10-CM | POA: Diagnosis not present

## 2015-10-28 DIAGNOSIS — M25562 Pain in left knee: Secondary | ICD-10-CM | POA: Diagnosis not present

## 2015-10-28 DIAGNOSIS — G8929 Other chronic pain: Secondary | ICD-10-CM | POA: Diagnosis not present

## 2015-10-31 DIAGNOSIS — G8929 Other chronic pain: Secondary | ICD-10-CM | POA: Diagnosis not present

## 2015-10-31 DIAGNOSIS — M25562 Pain in left knee: Secondary | ICD-10-CM | POA: Diagnosis not present

## 2015-11-02 DIAGNOSIS — M25562 Pain in left knee: Secondary | ICD-10-CM | POA: Diagnosis not present

## 2015-11-02 DIAGNOSIS — G8929 Other chronic pain: Secondary | ICD-10-CM | POA: Diagnosis not present

## 2015-11-04 DIAGNOSIS — G8929 Other chronic pain: Secondary | ICD-10-CM | POA: Diagnosis not present

## 2015-11-04 DIAGNOSIS — M25562 Pain in left knee: Secondary | ICD-10-CM | POA: Diagnosis not present

## 2015-11-07 DIAGNOSIS — G8929 Other chronic pain: Secondary | ICD-10-CM | POA: Diagnosis not present

## 2015-11-07 DIAGNOSIS — M25562 Pain in left knee: Secondary | ICD-10-CM | POA: Diagnosis not present

## 2015-11-10 DIAGNOSIS — M25562 Pain in left knee: Secondary | ICD-10-CM | POA: Diagnosis not present

## 2015-11-10 DIAGNOSIS — G8929 Other chronic pain: Secondary | ICD-10-CM | POA: Diagnosis not present

## 2015-11-10 DIAGNOSIS — Z96652 Presence of left artificial knee joint: Secondary | ICD-10-CM | POA: Diagnosis not present

## 2015-11-14 DIAGNOSIS — M25562 Pain in left knee: Secondary | ICD-10-CM | POA: Diagnosis not present

## 2015-11-14 DIAGNOSIS — G8929 Other chronic pain: Secondary | ICD-10-CM | POA: Diagnosis not present

## 2015-11-16 DIAGNOSIS — G8929 Other chronic pain: Secondary | ICD-10-CM | POA: Diagnosis not present

## 2015-11-16 DIAGNOSIS — M25562 Pain in left knee: Secondary | ICD-10-CM | POA: Diagnosis not present

## 2015-11-18 DIAGNOSIS — G8929 Other chronic pain: Secondary | ICD-10-CM | POA: Diagnosis not present

## 2015-11-18 DIAGNOSIS — M25562 Pain in left knee: Secondary | ICD-10-CM | POA: Diagnosis not present

## 2015-11-22 DIAGNOSIS — G8929 Other chronic pain: Secondary | ICD-10-CM | POA: Diagnosis not present

## 2015-11-22 DIAGNOSIS — M25562 Pain in left knee: Secondary | ICD-10-CM | POA: Diagnosis not present

## 2015-11-24 DIAGNOSIS — G8929 Other chronic pain: Secondary | ICD-10-CM | POA: Diagnosis not present

## 2015-11-24 DIAGNOSIS — M25562 Pain in left knee: Secondary | ICD-10-CM | POA: Diagnosis not present

## 2015-12-21 DIAGNOSIS — H18892 Other specified disorders of cornea, left eye: Secondary | ICD-10-CM | POA: Diagnosis not present

## 2015-12-26 DIAGNOSIS — Z471 Aftercare following joint replacement surgery: Secondary | ICD-10-CM | POA: Diagnosis not present

## 2016-01-06 DIAGNOSIS — I10 Essential (primary) hypertension: Secondary | ICD-10-CM | POA: Diagnosis not present

## 2016-01-18 DIAGNOSIS — I10 Essential (primary) hypertension: Secondary | ICD-10-CM | POA: Diagnosis not present

## 2016-01-18 DIAGNOSIS — E7800 Pure hypercholesterolemia, unspecified: Secondary | ICD-10-CM | POA: Diagnosis not present

## 2016-02-03 ENCOUNTER — Other Ambulatory Visit: Payer: Self-pay | Admitting: Internal Medicine

## 2016-02-03 DIAGNOSIS — Z1231 Encounter for screening mammogram for malignant neoplasm of breast: Secondary | ICD-10-CM

## 2016-02-23 ENCOUNTER — Other Ambulatory Visit: Payer: Self-pay | Admitting: Internal Medicine

## 2016-02-23 ENCOUNTER — Ambulatory Visit
Admission: RE | Admit: 2016-02-23 | Discharge: 2016-02-23 | Disposition: A | Discharge status: Home / Self Care | Payer: Commercial Managed Care - HMO | Source: Ambulatory Visit | Attending: Internal Medicine | Admitting: Internal Medicine

## 2016-02-23 DIAGNOSIS — Z1231 Encounter for screening mammogram for malignant neoplasm of breast: Secondary | ICD-10-CM | POA: Diagnosis not present

## 2016-02-29 DIAGNOSIS — M25512 Pain in left shoulder: Secondary | ICD-10-CM | POA: Diagnosis not present

## 2016-02-29 DIAGNOSIS — R040 Epistaxis: Secondary | ICD-10-CM | POA: Diagnosis not present

## 2016-05-07 DIAGNOSIS — R590 Localized enlarged lymph nodes: Secondary | ICD-10-CM | POA: Diagnosis not present

## 2016-05-07 DIAGNOSIS — M25512 Pain in left shoulder: Secondary | ICD-10-CM | POA: Diagnosis not present

## 2016-05-24 DIAGNOSIS — L040 Acute lymphadenitis of face, head and neck: Secondary | ICD-10-CM | POA: Diagnosis not present

## 2016-05-29 DIAGNOSIS — M7542 Impingement syndrome of left shoulder: Secondary | ICD-10-CM | POA: Diagnosis not present

## 2016-06-18 DIAGNOSIS — H40003 Preglaucoma, unspecified, bilateral: Secondary | ICD-10-CM | POA: Diagnosis not present

## 2016-06-26 DIAGNOSIS — H40003 Preglaucoma, unspecified, bilateral: Secondary | ICD-10-CM | POA: Diagnosis not present

## 2016-07-11 DIAGNOSIS — L040 Acute lymphadenitis of face, head and neck: Secondary | ICD-10-CM | POA: Diagnosis not present

## 2016-07-16 DIAGNOSIS — E7800 Pure hypercholesterolemia, unspecified: Secondary | ICD-10-CM | POA: Diagnosis not present

## 2016-07-16 DIAGNOSIS — I10 Essential (primary) hypertension: Secondary | ICD-10-CM | POA: Diagnosis not present

## 2016-07-23 DIAGNOSIS — N289 Disorder of kidney and ureter, unspecified: Secondary | ICD-10-CM | POA: Diagnosis not present

## 2016-07-23 DIAGNOSIS — Z0001 Encounter for general adult medical examination with abnormal findings: Secondary | ICD-10-CM | POA: Diagnosis not present

## 2016-07-23 DIAGNOSIS — E2839 Other primary ovarian failure: Secondary | ICD-10-CM | POA: Diagnosis not present

## 2016-07-23 DIAGNOSIS — Z0000 Encounter for general adult medical examination without abnormal findings: Secondary | ICD-10-CM | POA: Diagnosis not present

## 2016-07-23 DIAGNOSIS — I10 Essential (primary) hypertension: Secondary | ICD-10-CM | POA: Diagnosis not present

## 2016-07-23 DIAGNOSIS — E7800 Pure hypercholesterolemia, unspecified: Secondary | ICD-10-CM | POA: Diagnosis not present

## 2016-08-01 DIAGNOSIS — E2839 Other primary ovarian failure: Secondary | ICD-10-CM | POA: Diagnosis not present

## 2016-11-13 DIAGNOSIS — Z96652 Presence of left artificial knee joint: Secondary | ICD-10-CM | POA: Diagnosis not present

## 2016-12-28 DIAGNOSIS — H40003 Preglaucoma, unspecified, bilateral: Secondary | ICD-10-CM | POA: Diagnosis not present

## 2017-01-14 DIAGNOSIS — I10 Essential (primary) hypertension: Secondary | ICD-10-CM | POA: Diagnosis not present

## 2017-01-15 ENCOUNTER — Other Ambulatory Visit: Payer: Self-pay | Admitting: Internal Medicine

## 2017-01-15 DIAGNOSIS — Z1231 Encounter for screening mammogram for malignant neoplasm of breast: Secondary | ICD-10-CM

## 2017-01-21 DIAGNOSIS — I10 Essential (primary) hypertension: Secondary | ICD-10-CM | POA: Diagnosis not present

## 2017-01-21 DIAGNOSIS — E7800 Pure hypercholesterolemia, unspecified: Secondary | ICD-10-CM | POA: Diagnosis not present

## 2017-02-26 ENCOUNTER — Ambulatory Visit
Admission: RE | Admit: 2017-02-26 | Discharge: 2017-02-26 | Disposition: A | Discharge status: Home / Self Care | Payer: Commercial Managed Care - HMO | Source: Ambulatory Visit | Attending: Internal Medicine | Admitting: Internal Medicine

## 2017-02-26 DIAGNOSIS — Z1231 Encounter for screening mammogram for malignant neoplasm of breast: Secondary | ICD-10-CM | POA: Insufficient documentation

## 2017-05-07 DIAGNOSIS — M25511 Pain in right shoulder: Secondary | ICD-10-CM | POA: Diagnosis not present

## 2017-05-07 DIAGNOSIS — M7541 Impingement syndrome of right shoulder: Secondary | ICD-10-CM | POA: Diagnosis not present

## 2017-05-27 DIAGNOSIS — J069 Acute upper respiratory infection, unspecified: Secondary | ICD-10-CM | POA: Diagnosis not present

## 2017-06-21 DIAGNOSIS — H40003 Preglaucoma, unspecified, bilateral: Secondary | ICD-10-CM | POA: Diagnosis not present

## 2017-06-28 DIAGNOSIS — H18892 Other specified disorders of cornea, left eye: Secondary | ICD-10-CM | POA: Diagnosis not present

## 2017-07-17 DIAGNOSIS — I10 Essential (primary) hypertension: Secondary | ICD-10-CM | POA: Diagnosis not present

## 2017-07-17 DIAGNOSIS — E7800 Pure hypercholesterolemia, unspecified: Secondary | ICD-10-CM | POA: Diagnosis not present

## 2017-07-24 DIAGNOSIS — I10 Essential (primary) hypertension: Secondary | ICD-10-CM | POA: Diagnosis not present

## 2017-07-24 DIAGNOSIS — N289 Disorder of kidney and ureter, unspecified: Secondary | ICD-10-CM | POA: Diagnosis not present

## 2017-07-24 DIAGNOSIS — E7800 Pure hypercholesterolemia, unspecified: Secondary | ICD-10-CM | POA: Diagnosis not present

## 2017-07-24 DIAGNOSIS — Z0000 Encounter for general adult medical examination without abnormal findings: Secondary | ICD-10-CM | POA: Diagnosis not present

## 2017-07-24 DIAGNOSIS — Z0001 Encounter for general adult medical examination with abnormal findings: Secondary | ICD-10-CM | POA: Diagnosis not present

## 2017-11-14 DIAGNOSIS — Z96652 Presence of left artificial knee joint: Secondary | ICD-10-CM | POA: Diagnosis not present

## 2017-11-14 DIAGNOSIS — M1712 Unilateral primary osteoarthritis, left knee: Secondary | ICD-10-CM | POA: Diagnosis not present

## 2017-12-30 DIAGNOSIS — H40003 Preglaucoma, unspecified, bilateral: Secondary | ICD-10-CM | POA: Diagnosis not present

## 2018-01-16 DIAGNOSIS — N289 Disorder of kidney and ureter, unspecified: Secondary | ICD-10-CM | POA: Diagnosis not present

## 2018-01-22 DIAGNOSIS — N183 Chronic kidney disease, stage 3 (moderate): Secondary | ICD-10-CM | POA: Diagnosis not present

## 2018-01-22 DIAGNOSIS — N289 Disorder of kidney and ureter, unspecified: Secondary | ICD-10-CM | POA: Diagnosis not present

## 2018-01-22 DIAGNOSIS — E6609 Other obesity due to excess calories: Secondary | ICD-10-CM | POA: Diagnosis not present

## 2018-01-22 DIAGNOSIS — E7800 Pure hypercholesterolemia, unspecified: Secondary | ICD-10-CM | POA: Diagnosis not present

## 2018-01-22 DIAGNOSIS — I10 Essential (primary) hypertension: Secondary | ICD-10-CM | POA: Diagnosis not present

## 2018-01-22 DIAGNOSIS — H401130 Primary open-angle glaucoma, bilateral, stage unspecified: Secondary | ICD-10-CM | POA: Diagnosis not present

## 2018-01-22 DIAGNOSIS — Z6838 Body mass index (BMI) 38.0-38.9, adult: Secondary | ICD-10-CM | POA: Diagnosis not present

## 2018-01-27 ENCOUNTER — Other Ambulatory Visit: Payer: Self-pay | Admitting: Internal Medicine

## 2018-01-27 DIAGNOSIS — Z1231 Encounter for screening mammogram for malignant neoplasm of breast: Secondary | ICD-10-CM

## 2018-02-27 ENCOUNTER — Ambulatory Visit
Admission: RE | Admit: 2018-02-27 | Discharge: 2018-02-27 | Disposition: A | Discharge status: Home / Self Care | Payer: Medicare HMO | Source: Ambulatory Visit | Attending: Internal Medicine | Admitting: Internal Medicine

## 2018-02-27 DIAGNOSIS — Z1231 Encounter for screening mammogram for malignant neoplasm of breast: Secondary | ICD-10-CM

## 2018-06-23 DIAGNOSIS — H40003 Preglaucoma, unspecified, bilateral: Secondary | ICD-10-CM | POA: Diagnosis not present

## 2018-06-30 DIAGNOSIS — H2513 Age-related nuclear cataract, bilateral: Secondary | ICD-10-CM | POA: Diagnosis not present

## 2018-07-18 DIAGNOSIS — E7800 Pure hypercholesterolemia, unspecified: Secondary | ICD-10-CM | POA: Diagnosis not present

## 2018-07-18 DIAGNOSIS — N289 Disorder of kidney and ureter, unspecified: Secondary | ICD-10-CM | POA: Diagnosis not present

## 2018-07-24 DIAGNOSIS — N183 Chronic kidney disease, stage 3 unspecified: Secondary | ICD-10-CM | POA: Insufficient documentation

## 2018-07-25 DIAGNOSIS — Z0000 Encounter for general adult medical examination without abnormal findings: Secondary | ICD-10-CM | POA: Diagnosis not present

## 2018-07-25 DIAGNOSIS — M6530 Trigger finger, unspecified finger: Secondary | ICD-10-CM | POA: Diagnosis not present

## 2018-07-25 DIAGNOSIS — E7800 Pure hypercholesterolemia, unspecified: Secondary | ICD-10-CM | POA: Diagnosis not present

## 2018-07-25 DIAGNOSIS — N183 Chronic kidney disease, stage 3 (moderate): Secondary | ICD-10-CM | POA: Diagnosis not present

## 2018-07-25 DIAGNOSIS — I10 Essential (primary) hypertension: Secondary | ICD-10-CM | POA: Diagnosis not present

## 2018-07-25 DIAGNOSIS — Z0001 Encounter for general adult medical examination with abnormal findings: Secondary | ICD-10-CM | POA: Diagnosis not present

## 2018-07-29 DIAGNOSIS — M65341 Trigger finger, right ring finger: Secondary | ICD-10-CM | POA: Diagnosis not present

## 2018-07-29 DIAGNOSIS — M65322 Trigger finger, left index finger: Secondary | ICD-10-CM | POA: Diagnosis not present

## 2018-11-18 DIAGNOSIS — G8929 Other chronic pain: Secondary | ICD-10-CM | POA: Diagnosis not present

## 2018-11-18 DIAGNOSIS — M25511 Pain in right shoulder: Secondary | ICD-10-CM | POA: Diagnosis not present

## 2018-11-18 DIAGNOSIS — M1712 Unilateral primary osteoarthritis, left knee: Secondary | ICD-10-CM | POA: Diagnosis not present

## 2018-11-18 DIAGNOSIS — Z96652 Presence of left artificial knee joint: Secondary | ICD-10-CM | POA: Diagnosis not present

## 2019-01-07 DIAGNOSIS — H40053 Ocular hypertension, bilateral: Secondary | ICD-10-CM | POA: Diagnosis not present

## 2019-01-16 DIAGNOSIS — N183 Chronic kidney disease, stage 3 (moderate): Secondary | ICD-10-CM | POA: Diagnosis not present

## 2019-01-23 DIAGNOSIS — Z6838 Body mass index (BMI) 38.0-38.9, adult: Secondary | ICD-10-CM | POA: Diagnosis not present

## 2019-01-23 DIAGNOSIS — J329 Chronic sinusitis, unspecified: Secondary | ICD-10-CM | POA: Diagnosis not present

## 2019-01-23 DIAGNOSIS — E7800 Pure hypercholesterolemia, unspecified: Secondary | ICD-10-CM | POA: Diagnosis not present

## 2019-01-23 DIAGNOSIS — I10 Essential (primary) hypertension: Secondary | ICD-10-CM | POA: Diagnosis not present

## 2019-01-23 DIAGNOSIS — N183 Chronic kidney disease, stage 3 (moderate): Secondary | ICD-10-CM | POA: Diagnosis not present

## 2019-01-23 DIAGNOSIS — E6609 Other obesity due to excess calories: Secondary | ICD-10-CM | POA: Diagnosis not present

## 2019-01-26 ENCOUNTER — Other Ambulatory Visit: Payer: Self-pay | Admitting: Internal Medicine

## 2019-01-26 DIAGNOSIS — Z1231 Encounter for screening mammogram for malignant neoplasm of breast: Secondary | ICD-10-CM

## 2019-03-03 ENCOUNTER — Ambulatory Visit
Admission: RE | Admit: 2019-03-03 | Discharge: 2019-03-03 | Disposition: A | Discharge status: Home / Self Care | Payer: Medicare HMO | Source: Ambulatory Visit | Attending: Internal Medicine | Admitting: Internal Medicine

## 2019-03-03 DIAGNOSIS — Z1231 Encounter for screening mammogram for malignant neoplasm of breast: Secondary | ICD-10-CM | POA: Diagnosis not present

## 2019-04-30 DIAGNOSIS — S39012A Strain of muscle, fascia and tendon of lower back, initial encounter: Secondary | ICD-10-CM | POA: Diagnosis not present

## 2019-04-30 DIAGNOSIS — N183 Chronic kidney disease, stage 3 unspecified: Secondary | ICD-10-CM | POA: Diagnosis not present

## 2019-04-30 DIAGNOSIS — M545 Low back pain: Secondary | ICD-10-CM | POA: Diagnosis not present

## 2019-05-19 DIAGNOSIS — M25561 Pain in right knee: Secondary | ICD-10-CM | POA: Diagnosis not present

## 2019-05-19 DIAGNOSIS — M1711 Unilateral primary osteoarthritis, right knee: Secondary | ICD-10-CM | POA: Diagnosis not present

## 2019-06-26 DIAGNOSIS — M25561 Pain in right knee: Secondary | ICD-10-CM | POA: Diagnosis not present

## 2019-06-26 DIAGNOSIS — M1711 Unilateral primary osteoarthritis, right knee: Secondary | ICD-10-CM | POA: Diagnosis not present

## 2019-06-30 DIAGNOSIS — M1711 Unilateral primary osteoarthritis, right knee: Secondary | ICD-10-CM | POA: Diagnosis not present

## 2019-06-30 DIAGNOSIS — M25461 Effusion, right knee: Secondary | ICD-10-CM | POA: Diagnosis not present

## 2019-07-17 DIAGNOSIS — H40003 Preglaucoma, unspecified, bilateral: Secondary | ICD-10-CM | POA: Diagnosis not present

## 2019-07-20 DIAGNOSIS — N183 Chronic kidney disease, stage 3 unspecified: Secondary | ICD-10-CM | POA: Diagnosis not present

## 2019-07-20 DIAGNOSIS — E7800 Pure hypercholesterolemia, unspecified: Secondary | ICD-10-CM | POA: Diagnosis not present

## 2019-07-28 DIAGNOSIS — E7800 Pure hypercholesterolemia, unspecified: Secondary | ICD-10-CM | POA: Diagnosis not present

## 2019-07-28 DIAGNOSIS — N183 Chronic kidney disease, stage 3 unspecified: Secondary | ICD-10-CM | POA: Diagnosis not present

## 2019-07-28 DIAGNOSIS — I129 Hypertensive chronic kidney disease with stage 1 through stage 4 chronic kidney disease, or unspecified chronic kidney disease: Secondary | ICD-10-CM | POA: Diagnosis not present

## 2019-07-28 DIAGNOSIS — Z0000 Encounter for general adult medical examination without abnormal findings: Secondary | ICD-10-CM | POA: Diagnosis not present

## 2019-08-06 DIAGNOSIS — H40003 Preglaucoma, unspecified, bilateral: Secondary | ICD-10-CM | POA: Diagnosis not present

## 2019-11-20 DIAGNOSIS — M1711 Unilateral primary osteoarthritis, right knee: Secondary | ICD-10-CM | POA: Diagnosis not present

## 2019-11-20 DIAGNOSIS — Z96652 Presence of left artificial knee joint: Secondary | ICD-10-CM | POA: Diagnosis not present

## 2020-01-18 DIAGNOSIS — I10 Essential (primary) hypertension: Secondary | ICD-10-CM | POA: Diagnosis not present

## 2020-01-18 DIAGNOSIS — E7800 Pure hypercholesterolemia, unspecified: Secondary | ICD-10-CM | POA: Diagnosis not present

## 2020-01-25 DIAGNOSIS — H401130 Primary open-angle glaucoma, bilateral, stage unspecified: Secondary | ICD-10-CM | POA: Diagnosis not present

## 2020-01-25 DIAGNOSIS — N183 Chronic kidney disease, stage 3 unspecified: Secondary | ICD-10-CM | POA: Diagnosis not present

## 2020-01-25 DIAGNOSIS — I129 Hypertensive chronic kidney disease with stage 1 through stage 4 chronic kidney disease, or unspecified chronic kidney disease: Secondary | ICD-10-CM | POA: Diagnosis not present

## 2020-01-25 DIAGNOSIS — E7800 Pure hypercholesterolemia, unspecified: Secondary | ICD-10-CM | POA: Diagnosis not present

## 2020-02-01 ENCOUNTER — Encounter
Admission: RE | Admit: 2020-02-01 | Discharge: 2020-02-01 | Disposition: A | Discharge status: Home / Self Care | Payer: Medicare HMO | Source: Ambulatory Visit | Attending: Orthopedic Surgery | Admitting: Orthopedic Surgery

## 2020-02-01 ENCOUNTER — Other Ambulatory Visit: Payer: Self-pay

## 2020-02-01 DIAGNOSIS — Z01818 Encounter for other preprocedural examination: Secondary | ICD-10-CM | POA: Diagnosis not present

## 2020-02-01 LAB — APTT: aPTT: 33 seconds (ref 24–36)

## 2020-02-01 LAB — C-REACTIVE PROTEIN: CRP: 0.8 mg/dL (ref <1.0)

## 2020-02-01 LAB — URINALYSIS, ROUTINE W REFLEX MICROSCOPIC
Bilirubin Urine: NEGATIVE
Glucose, UA: NEGATIVE mg/dL
Hgb urine dipstick: NEGATIVE
Ketones, ur: NEGATIVE mg/dL
Leukocytes,Ua: NEGATIVE
Nitrite: NEGATIVE
Protein, ur: NEGATIVE mg/dL
Specific Gravity, Urine: 1.012 (ref 1.005–1.030)
pH: 6 (ref 5.0–8.0)

## 2020-02-01 LAB — COMPREHENSIVE METABOLIC PANEL
ALT: 14 U/L (ref 0–44)
AST: 19 U/L (ref 15–41)
Albumin: 4 g/dL (ref 3.5–5.0)
Alkaline Phosphatase: 58 U/L (ref 38–126)
Anion gap: 8 (ref 5–15)
BUN: 22 mg/dL (ref 8–23)
CO2: 27 mmol/L (ref 22–32)
Calcium: 9.7 mg/dL (ref 8.9–10.3)
Chloride: 105 mmol/L (ref 98–111)
Creatinine, Ser: 1.62 mg/dL — ABNORMAL HIGH (ref 0.44–1.00)
GFR calc Af Amer: 35 mL/min — ABNORMAL LOW (ref >60)
GFR calc non Af Amer: 30 mL/min — ABNORMAL LOW (ref >60)
Glucose, Bld: 102 mg/dL — ABNORMAL HIGH (ref 70–99)
Potassium: 3.7 mmol/L (ref 3.5–5.1)
Sodium: 140 mmol/L (ref 135–145)
Total Bilirubin: 0.8 mg/dL (ref 0.3–1.2)
Total Protein: 7 g/dL (ref 6.5–8.1)

## 2020-02-01 LAB — PROTIME-INR
INR: 1 (ref 0.8–1.2)
Prothrombin Time: 12.4 seconds (ref 11.4–15.2)

## 2020-02-01 LAB — TYPE AND SCREEN
ABO/RH(D): A POS
Antibody Screen: NEGATIVE

## 2020-02-01 LAB — CBC
HCT: 33.7 % — ABNORMAL LOW (ref 36.0–46.0)
Hemoglobin: 11.1 g/dL — ABNORMAL LOW (ref 12.0–15.0)
MCH: 28.8 pg (ref 26.0–34.0)
MCHC: 32.9 g/dL (ref 30.0–36.0)
MCV: 87.5 fL (ref 80.0–100.0)
Platelets: 317 10*3/uL (ref 150–400)
RBC: 3.85 MIL/uL — ABNORMAL LOW (ref 3.87–5.11)
RDW: 14.6 % (ref 11.5–15.5)
WBC: 7.6 10*3/uL (ref 4.0–10.5)
nRBC: 0 % (ref 0.0–0.2)

## 2020-02-01 LAB — SURGICAL PCR SCREEN
MRSA, PCR: NEGATIVE
Staphylococcus aureus: NEGATIVE

## 2020-02-01 LAB — SEDIMENTATION RATE: Sed Rate: 43 mm/hr — ABNORMAL HIGH (ref 0–30)

## 2020-02-01 NOTE — Discharge Instructions (Signed)
Instructions after Total Knee Replacement   Karlee Staff P. Jasimine Simms, Jr., M.D.     Dept. of Orthopaedics & Sports Medicine  [facility]  [address]  [place], [place]  [address]  Phone: [phone]   Fax: [phone]    DIET: Drink plenty of non-alcoholic fluids. Resume your normal diet. Include foods high in fiber.  ACTIVITY:  You may use crutches or a walker with weight-bearing as tolerated, unless instructed otherwise. You may be weaned off of the walker or crutches by your Physical Therapist.  Do NOT place pillows under the knee. Anything placed under the knee could limit your ability to straighten the knee.   Continue doing gentle exercises. Exercising will reduce the pain and swelling, increase motion, and prevent muscle weakness.   Please continue to use the TED compression stockings for 6 weeks. You may remove the stockings at night, but should reapply them in the morning. Do not drive or operate any equipment until instructed.  WOUND CARE:  Continue to use the PolarCare or ice packs periodically to reduce pain and swelling. You may bathe or shower after the staples are removed at the first office visit following surgery.  MEDICATIONS: You may resume your regular medications. Please take the pain medication as prescribed on the medication. Do not take pain medication on an empty stomach. You have been given a prescription for a blood thinner (Lovenox or Coumadin). Please take the medication as instructed. (NOTE: After completing a 2 week course of Lovenox, take one Enteric-coated aspirin once a day. This along with elevation will help reduce the possibility of phlebitis in your operated leg.) Do not drive or drink alcoholic beverages when taking pain medications.  CALL THE OFFICE FOR: Temperature above 101 degrees Excessive bleeding or drainage on the dressing. Excessive swelling, coldness, or paleness of the toes. Persistent nausea and vomiting.  FOLLOW-UP:  You  should have an appointment to return to the office in 10-14 days after surgery. Arrangements have been made for continuation of Physical Therapy (either home therapy or outpatient therapy).   [facility] Department Directory         [email or web address]       [email or web address]          Cardiology  Appointments: [place] - [phone] [place] - [phone]  Endocrinology  Appointments: [place] - [phone] [place] - [phone]  Gastroenterology  Appointments: [place] - [phone] [place] - [phone]        General Surgery   Appointments: [place] - [phone]  Internal Medicine/Family Medicine  Appointments: [place] - [phone] [place] - [phone] [place] - [phone]  Metabolic and Weigh Loss Surgery  Appointments: [place] - [phone]        Neurology  Appointments: [place] - [phone] [place] - [phone]  Neurosurgery  Appointments: [place] - [phone]  Obstetrics & Gynecology  Appointments: [place] - [phone] [place] - [phone]        Pediatrics  Appointments: [place] - [phone] [place] - [phone]  Physiatry  Appointments: [place] -[phone]  Physical Therapy  Appointments: [place] - [phone] [place] - [phone]        Podiatry  Appointments: [place] - [phone] [place] - [phone]  Pulmonology  Appointments: [place] - [phone]  Rheumatology  Appointments: [place] - [phone]        [place] Location: [facility]  [address] [place], [place]  [address]  [place] Location: [facility] [address] [place], [place]  [address]  [place] Location: [facility] [address] [place], [place]  [address]    

## 2020-02-01 NOTE — Patient Instructions (Signed)
Your procedure is scheduled on: Monday February 08, 2020. Report to Day Surgery. To find out your arrival time please call [phone] between 1PM - 3PM on Friday February 05, 2020.  Remember: Instructions that are not followed completely may result in serious medical risk,  up to and including death, or upon the discretion of your surgeon and anesthesiologist your  surgery may need to be rescheduled.     _X__ 1. Do not eat food after midnight the night before your procedure.                 No gum chewing or hard candies. You may drink clear liquids up to 2 hours                 before you are scheduled to arrive for your surgery- DO not drink clear                 liquids within 2 hours of the start of your surgery.                 Clear Liquids include:  water, apple juice without pulp, clear Gatorade, G2 or                  Gatorade Zero (avoid Red/Purple/Blue), Black Coffee or Tea (Do not add                 anything to coffee or tea).  __X__2.   Complete the carbohydrate drink provided to you, 2 hours before arrival.  __X__3.  On the morning of surgery brush your teeth with toothpaste and water, you                may rinse your mouth with mouthwash if you wish.  Do not swallow any toothpaste of mouthwash.     _X__ 4.  No Alcohol for 24 hours before or after surgery.   _X__ 5.  Do Not Smoke or use e-cigarettes For 24 Hours Prior to Your Surgery.                 Do not use any chewable tobacco products for at least 6 hours prior to                 Surgery.  _X__  6.  Do not use any recreational drugs (marijuana, cocaine, heroin, ecstacy, MDMA or other)                For at least one week prior to your surgery.  Combination of these drugs with anesthesia                May have life threatening results.   __X__  7.  Notify your doctor if there is any change in your medical condition      (cold, fever, infections).     Do not wear jewelry, make-up, hairpins,  clips or nail polish. Do not wear lotions, powders, or perfumes. You may wear deodorant. Do not shave 48 hours prior to surgery. Men may shave face and neck. Do not bring valuables to the hospital.    [facility] is not responsible for any belongings or valuables.  Contacts, dentures or bridgework may not be worn into surgery. Leave your suitcase in the car. After surgery it may be brought to your room. For patients admitted to the hospital, discharge time is determined by your treatment team.   Patients discharged the day of surgery will not be  allowed to drive home.   Make arrangements for someone to be with you for the first 24 hours of your Same Day Discharge.    Please read over the following fact sheets that you were given:   Total Joint Packet   __X__ Take these medicines the morning of surgery with A SIP OF WATER:    1. acetaminophen (TYLENOL) 650 MG  2. oxybutynin (DITROPAN) 5 MG  __X__ Use CHG Soap (or wipes) as directed  __X__ Use nasal spray on the day of surgery  fluticasone (FLONASE) 50 MCG/ACT nasal spray  __X__ Stop Anti-inflammatories such as Ibuprofen, aleve, Advil, naproxen, aspirin and or BC powders.    __X__ Stop supplements until after surgery.    __X__ Do not start any herbal supplements before your surgery.

## 2020-02-03 DIAGNOSIS — M1711 Unilateral primary osteoarthritis, right knee: Secondary | ICD-10-CM | POA: Diagnosis not present

## 2020-02-03 LAB — URINE CULTURE: Special Requests: NORMAL

## 2020-02-04 ENCOUNTER — Other Ambulatory Visit: Payer: Self-pay

## 2020-02-04 ENCOUNTER — Other Ambulatory Visit
Admission: RE | Admit: 2020-02-04 | Discharge: 2020-02-04 | Disposition: A | Discharge status: Home / Self Care | Payer: Medicare HMO | Source: Ambulatory Visit | Attending: Orthopedic Surgery | Admitting: Orthopedic Surgery

## 2020-02-04 DIAGNOSIS — Z01812 Encounter for preprocedural laboratory examination: Secondary | ICD-10-CM | POA: Diagnosis not present

## 2020-02-04 DIAGNOSIS — Z20822 Contact with and (suspected) exposure to covid-19: Secondary | ICD-10-CM | POA: Diagnosis not present

## 2020-02-04 LAB — SARS CORONAVIRUS 2 (TAT 6-24 HRS): SARS Coronavirus 2: NEGATIVE

## 2020-02-07 ENCOUNTER — Encounter: Payer: Self-pay | Admitting: Orthopedic Surgery

## 2020-02-07 NOTE — H&P (Signed)
ORTHOPAEDIC HISTORY & PHYSICAL Progress Notes [place], [place] - 02/03/2020 2:45 PM EDT Chief Complaint: Chief Complaint  Patient presents with  . Follow-up  H&P Right total knee arthroplasty, scheduled for 02/08/2020   Linda Ibarra is a 79 y.o. female who presents today for her surgical history and physical for upcoming right total knee arthroplasty. Surgery is scheduled with Dr. Ernest Pine on 02/08/2020. The patient denies any changes in her medical history since she was last evaluated. She denies any numbness or tingling to the right lower extremity at today's visit. She denies any falls or trauma since she was last evaluated. The patient denies any personal history of heart attack, stroke, asthma or COPD. No personal history of blood clots. She does require updated right knee x-rays at today's appointment. The patient does have a history of a lumbar fusion with hardware placement.  Past Medical History: Past Medical History:  Diagnosis Date  . Allergic state  . Arthritis  . 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 Glaucoma  . Glaucoma (increased eye pressure)  . H/O bone density study  08/06/05; 02/23/10; 03/06/12  . H/O mammogram  02/02/10; 06/15/11; 01/05/13  . Iron deficiency anemia  . Obesity  . Renal insufficiency  . Urinary incontinence  . Vitamin D deficiency   Past Surgical History: Past Surgical History:  Procedure Laterality Date  . COLONOSCOPY  02/24/96; 08/13/11 (Repeat 08/12/21)  . DILATION AND CURETTAGE, DIAGNOSTIC / THERAPEUTIC 1969  . HYSTERECTOMY  . LATERAL FUSION LUMBAR SPINE 2012  L2-3, L3-4, and L4-5 ([place])  . Left total knee arthroplasty using computer-assisted navigation 09/28/2015  Dr Ernest Pine   Past Family History: Family History  Problem Relation Age of Onset  . High blood pressure (Hypertension) Mother  . Ovarian cancer Mother  . High blood pressure (Hypertension) Father  .  Prostate cancer Brother   Medications: Current Outpatient Medications Ordered in Epic  Medication Sig Dispense Refill  . acetaminophen (TYLENOL) 650 MG ER tablet Take by mouth Take 1,300 mg by mouth in the morning and at bedtime.  Marland Kitchen amoxicillin (AMOXIL) 500 MG capsule TAKE 4 CAPSULES BY MOUTH ONCE  . CALCIUM CITRATE-VITAMIN D2 ORAL Take 1 capsule by mouth once daily  . dorzolamide-timolol (COSOPT) 22.3-6.8 mg/mL ophthalmic solution Place 1 drop into both eyes 2 (two) times daily  . fluticasone propionate (FLONASE) 50 mcg/actuation nasal spray by Nasal route Place 1 spray into both nostrils as needed  . FOLIC ACID/MULTIVIT-MIN/LUTEIN (CENTRUM SILVER ORAL) Take 1 tablet by mouth once daily.   . hydroCHLOROthiazide (HYDRODIURIL) 25 MG tablet TAKE 1 TABLET (25 MG TOTAL) BY MOUTH ONCE DAILY 90 tablet 1  . latanoprost (XALATAN) 0.005 % ophthalmic solution Place 1 drop into both eyes nightly  . losartan (COZAAR) 25 MG tablet Take 1 tablet (25 mg total) by mouth once daily 90 tablet 1  . oxybutynin (DITROPAN) 5 mg tablet TAKE 1 TABLET EVERY DAY 90 tablet 1  . red yeast rice 600 mg Tab Take by mouth Take 600 mg by mouth daily.   No current Epic-ordered facility-administered medications on file.   Allergies: Allergies  Allergen Reactions  . Pseudoephedrine Hcl Hives  . Metoprolol Tartrate (Bulk) Headache  . Tramadol Nausea and Vomiting    Review of Systems:  A comprehensive 14 point ROS was performed, reviewed by me today, and the pertinent orthopaedic findings are documented in the HPI.  Exam: BP 138/86  Ht 149.9 cm (4\' 11" )  Wt 81.4 kg (179 lb 6.4 oz)  BMI 36.23 kg/m  General/Constitutional: The patient appears to be well-nourished, well-developed, and in no acute distress. Neuro/Psych: Normal mood and affect, oriented to person, place and time. Eyes: Non-icteric. Pupils are equal, round, and reactive to light, and exhibit synchronous movement. ENT: Unremarkable. Lymphatic: No  palpable adenopathy. Respiratory: Lungs clear to auscultation, Normal chest excursion, No wheezes and Non-labored breathing Cardiovascular: Regular rate and rhythm. No murmurs. and No edema, swelling or tenderness, except as noted in detailed exam. Integumentary: No impressive skin lesions present, except as noted in detailed exam. Musculoskeletal: Unremarkable, except as noted in detailed exam.  Skin examination of the right knee demonstrates mild soft tissue swelling. No open wound or signs of infection to the right lower extremity. The right knee has a relative valgus alignment with lateral pseudolaxity. There is no significant effusion, erythema. The patient does have mild crepitus with range of motion activity. She is tender to palpation along the medial and lateral joint line. There is good tracking of the left patella. No significant quadricep atrophy. The patient is able to fully extend the right knee is able to flex 90 degrees.  Imaging: AP, lateral and sunrise views of the right knee were obtained today in the office and reviewed by me. These x-rays demonstrate significant right knee osteoarthritic changes with complete loss of joint space along the medial compartment and 90% joint space loss along lateral compartment. Osteophyte formation off of the medial and lateral femoral condyles. Moderate subchondral sclerosis throughout the knee. Decreased joint space throughout the patellofemoral compartment as well.  Impression: Primary osteoarthritis of right knee [M17.11] Primary osteoarthritis of right knee (primary encounter diagnosis)  Plan:  1. Treatment options were discussed today with the patient. 2. The patient is scheduled for a right total knee arthroplasty with Dr. Ernest Pine on 02/08/2020. 3. Updated right knee x-rays were obtained at today's visit. 4. The patient was instructed on the risk and benefits of a right total knee arthroplasty and wishes to proceed at this time. 5. This  document will serve as a surgical history and physical for the patient. 6. The patient will follow-up per standard post-op protocol. They can call the clinic they have any questions, new symptoms develop or symptoms worsen.  The procedure was discussed with the patient, as were the potential risks (including bleeding, infection, nerve and/or blood vessel injury, persistent or recurrent pain, failure of the hardware, need for further surgery, blood clots, strokes, heart attacks and/or arhythmias, pneumonia, etc.) and benefits. The patient states her understanding and wishes to proceed.  This office visit took 30 minutes, of which >50% involved patient counseling/education.  Review of the [place] CSRS was performed in accordance of the [employer] prior to dispensing any controlled drugs.  This note was generated in part with voice recognition software and I apologize for any typographical errors that were not detected and corrected.  Valeria Batman, PA-C [facility]   Electronically signed by [place], PA at 02/04/2020 1:47 PM EDT

## 2020-02-08 ENCOUNTER — Encounter
Admission: AD | Disposition: A | Discharge status: Skilled Nursing Facility | Payer: Self-pay | Source: Home / Self Care | Attending: Orthopedic Surgery

## 2020-02-08 ENCOUNTER — Inpatient Hospital Stay: Payer: Medicare HMO

## 2020-02-08 ENCOUNTER — Other Ambulatory Visit: Payer: Self-pay

## 2020-02-08 ENCOUNTER — Encounter: Payer: Self-pay | Admitting: Orthopedic Surgery

## 2020-02-08 ENCOUNTER — Inpatient Hospital Stay
Admission: AD | Admit: 2020-02-08 | Discharge: 2020-02-12 | DRG: 470 | Disposition: A | Discharge status: Skilled Nursing Facility | Payer: Medicare HMO | Attending: Orthopedic Surgery | Admitting: Orthopedic Surgery

## 2020-02-08 ENCOUNTER — Ambulatory Visit: Payer: Medicare HMO | Admitting: Anesthesiology

## 2020-02-08 DIAGNOSIS — Z8249 Family history of ischemic heart disease and other diseases of the circulatory system: Secondary | ICD-10-CM | POA: Diagnosis not present

## 2020-02-08 DIAGNOSIS — S82131A Displaced fracture of medial condyle of right tibia, initial encounter for closed fracture: Secondary | ICD-10-CM | POA: Diagnosis not present

## 2020-02-08 DIAGNOSIS — H409 Unspecified glaucoma: Secondary | ICD-10-CM | POA: Diagnosis not present

## 2020-02-08 DIAGNOSIS — Z981 Arthrodesis status: Secondary | ICD-10-CM | POA: Diagnosis not present

## 2020-02-08 DIAGNOSIS — Z471 Aftercare following joint replacement surgery: Secondary | ICD-10-CM | POA: Diagnosis not present

## 2020-02-08 DIAGNOSIS — Z79899 Other long term (current) drug therapy: Secondary | ICD-10-CM

## 2020-02-08 DIAGNOSIS — M96671 Fracture of tibia or fibula following insertion of orthopedic implant, joint prosthesis, or bone plate, right leg: Secondary | ICD-10-CM | POA: Diagnosis not present

## 2020-02-08 DIAGNOSIS — Z7401 Bed confinement status: Secondary | ICD-10-CM | POA: Diagnosis not present

## 2020-02-08 DIAGNOSIS — Z96651 Presence of right artificial knee joint: Secondary | ICD-10-CM | POA: Diagnosis not present

## 2020-02-08 DIAGNOSIS — M25761 Osteophyte, right knee: Secondary | ICD-10-CM | POA: Diagnosis present

## 2020-02-08 DIAGNOSIS — I10 Essential (primary) hypertension: Secondary | ICD-10-CM | POA: Diagnosis not present

## 2020-02-08 DIAGNOSIS — M1711 Unilateral primary osteoarthritis, right knee: Principal | ICD-10-CM | POA: Diagnosis present

## 2020-02-08 DIAGNOSIS — R5381 Other malaise: Secondary | ICD-10-CM | POA: Diagnosis not present

## 2020-02-08 DIAGNOSIS — Z419 Encounter for procedure for purposes other than remedying health state, unspecified: Secondary | ICD-10-CM

## 2020-02-08 DIAGNOSIS — M2550 Pain in unspecified joint: Secondary | ICD-10-CM | POA: Diagnosis not present

## 2020-02-08 DIAGNOSIS — Z96659 Presence of unspecified artificial knee joint: Secondary | ICD-10-CM

## 2020-02-08 HISTORY — PX: ORIF TIBIA PLATEAU: SHX2132

## 2020-02-08 HISTORY — PX: KNEE ARTHROPLASTY: SHX992

## 2020-02-08 SURGERY — ARTHROPLASTY, KNEE, TOTAL, USING IMAGELESS COMPUTER-ASSISTED NAVIGATION
Anesthesia: Choice | Site: Knee | Laterality: Right

## 2020-02-08 MED ORDER — MAGNESIUM HYDROXIDE 400 MG/5ML PO SUSP
30.0000 mL | Freq: Every day | ORAL | Status: DC
  Administered 2020-02-09 – 2020-02-10 (×2): 30 mL via ORAL
  Filled 2020-02-08 (×2): qty 30

## 2020-02-08 MED ORDER — ONDANSETRON HCL 4 MG/2ML IJ SOLN: 4.0000 mg | Freq: Once | INTRAMUSCULAR | Status: DC | PRN

## 2020-02-08 MED ORDER — BUPIVACAINE HCL (PF) 0.5 % IJ SOLN
INTRAMUSCULAR | Status: AC
  Filled 2020-02-08: qty 10

## 2020-02-08 MED ORDER — ACETAMINOPHEN 10 MG/ML IV SOLN
INTRAVENOUS | Status: DC | PRN
  Administered 2020-02-08: 1000 mg via INTRAVENOUS

## 2020-02-08 MED ORDER — HYDROCHLOROTHIAZIDE 25 MG PO TABS
12.5000 mg | ORAL_TABLET | Freq: Every day | ORAL | Status: DC
  Administered 2020-02-10 – 2020-02-12 (×3): 12.5 mg via ORAL
  Filled 2020-02-08 (×4): qty 1

## 2020-02-08 MED ORDER — ONDANSETRON HCL 4 MG/2ML IJ SOLN
INTRAMUSCULAR | Status: AC
  Filled 2020-02-08: qty 2

## 2020-02-08 MED ORDER — ACETAMINOPHEN 10 MG/ML IV SOLN
INTRAVENOUS | Status: AC
  Filled 2020-02-08: qty 100

## 2020-02-08 MED ORDER — SODIUM CHLORIDE 0.9 % IV SOLN: INTRAVENOUS | Status: DC

## 2020-02-08 MED ORDER — FAMOTIDINE 20 MG PO TABS: 20.0000 mg | ORAL_TABLET | Freq: Once | ORAL | Status: AC

## 2020-02-08 MED ORDER — SODIUM CHLORIDE 0.9 % IV SOLN
INTRAVENOUS | Status: DC | PRN
  Administered 2020-02-08: 40 ug/min via INTRAVENOUS

## 2020-02-08 MED ORDER — CELECOXIB 200 MG PO CAPS: 400.0000 mg | ORAL_CAPSULE | Freq: Once | ORAL | Status: AC

## 2020-02-08 MED ORDER — PROPOFOL 10 MG/ML IV BOLUS
INTRAVENOUS | Status: AC
  Filled 2020-02-08: qty 20

## 2020-02-08 MED ORDER — BUPIVACAINE HCL (PF) 0.5 % IJ SOLN: INTRAMUSCULAR | Status: DC | PRN

## 2020-02-08 MED ORDER — SODIUM CHLORIDE 0.9 % IV SOLN
INTRAVENOUS | Status: DC | PRN
  Administered 2020-02-08: 60 mL

## 2020-02-08 MED ORDER — LIDOCAINE HCL 1 % IJ SOLN
INTRAMUSCULAR | Status: DC | PRN
  Administered 2020-02-08: 3 mL

## 2020-02-08 MED ORDER — HYDROMORPHONE HCL 1 MG/ML IJ SOLN
0.5000 mg | INTRAMUSCULAR | Status: DC | PRN
  Administered 2020-02-08: 1 mg via INTRAVENOUS
  Filled 2020-02-08: qty 1

## 2020-02-08 MED ORDER — NEOMYCIN-POLYMYXIN B GU 40-200000 IR SOLN
Status: DC | PRN
  Administered 2020-02-08: 14 mL

## 2020-02-08 MED ORDER — OXYCODONE HCL 5 MG PO TABS
5.0000 mg | ORAL_TABLET | ORAL | Status: DC | PRN
  Administered 2020-02-10 – 2020-02-12 (×3): 5 mg via ORAL
  Filled 2020-02-08 (×3): qty 1

## 2020-02-08 MED ORDER — FLEET ENEMA 7-19 GM/118ML RE ENEM: 1.0000 | ENEMA | Freq: Once | RECTAL | Status: DC | PRN

## 2020-02-08 MED ORDER — DEXAMETHASONE SODIUM PHOSPHATE 10 MG/ML IJ SOLN
INTRAMUSCULAR | Status: AC
  Administered 2020-02-08: 8 mg via INTRAVENOUS
  Filled 2020-02-08: qty 1

## 2020-02-08 MED ORDER — SODIUM CHLORIDE (PF) 0.9 % IJ SOLN
INTRAMUSCULAR | Status: AC
  Filled 2020-02-08: qty 20

## 2020-02-08 MED ORDER — TRANEXAMIC ACID-NACL 1000-0.7 MG/100ML-% IV SOLN
1000.0000 mg | INTRAVENOUS | Status: AC
  Administered 2020-02-08: 1000 mg via INTRAVENOUS

## 2020-02-08 MED ORDER — ACETAMINOPHEN 325 MG PO TABS: 325.0000 mg | ORAL_TABLET | Freq: Four times a day (QID) | ORAL | Status: DC | PRN

## 2020-02-08 MED ORDER — FENTANYL CITRATE (PF) 100 MCG/2ML IJ SOLN
25.0000 ug | INTRAMUSCULAR | Status: DC | PRN
  Administered 2020-02-08: 50 ug via INTRAVENOUS

## 2020-02-08 MED ORDER — LATANOPROST 0.005 % OP SOLN
1.0000 [drp] | Freq: Every day | OPHTHALMIC | Status: DC
  Administered 2020-02-08 – 2020-02-11 (×4): 1 [drp] via OPHTHALMIC
  Filled 2020-02-08: qty 2.5

## 2020-02-08 MED ORDER — ACETAMINOPHEN 10 MG/ML IV SOLN: 1000.0000 mg | Freq: Once | INTRAVENOUS | Status: DC | PRN

## 2020-02-08 MED ORDER — PROPOFOL 500 MG/50ML IV EMUL
INTRAVENOUS | Status: AC
  Filled 2020-02-08: qty 50

## 2020-02-08 MED ORDER — DEXAMETHASONE SODIUM PHOSPHATE 10 MG/ML IJ SOLN: 8.0000 mg | Freq: Once | INTRAMUSCULAR | Status: AC

## 2020-02-08 MED ORDER — BUPIVACAINE HCL (PF) 0.25 % IJ SOLN
INTRAMUSCULAR | Status: DC | PRN
  Administered 2020-02-08: 60 mL

## 2020-02-08 MED ORDER — TRANEXAMIC ACID-NACL 1000-0.7 MG/100ML-% IV SOLN
INTRAVENOUS | Status: AC
  Filled 2020-02-08: qty 100

## 2020-02-08 MED ORDER — ALUM & MAG HYDROXIDE-SIMETH 200-200-20 MG/5ML PO SUSP: 30.0000 mL | ORAL | Status: DC | PRN

## 2020-02-08 MED ORDER — ONDANSETRON HCL 4 MG PO TABS: 4.0000 mg | ORAL_TABLET | Freq: Four times a day (QID) | ORAL | Status: DC | PRN

## 2020-02-08 MED ORDER — SENNOSIDES-DOCUSATE SODIUM 8.6-50 MG PO TABS
1.0000 | ORAL_TABLET | Freq: Two times a day (BID) | ORAL | Status: DC
  Administered 2020-02-08 – 2020-02-12 (×7): 1 via ORAL
  Filled 2020-02-08 (×8): qty 1

## 2020-02-08 MED ORDER — GLYCOPYRROLATE 0.2 MG/ML IJ SOLN
INTRAMUSCULAR | Status: DC | PRN
Start: 2020-02-08 — End: 2020-02-08
  Administered 2020-02-08: .2 mg via INTRAVENOUS

## 2020-02-08 MED ORDER — GLYCOPYRROLATE 0.2 MG/ML IJ SOLN
INTRAMUSCULAR | Status: AC
  Filled 2020-02-08: qty 1

## 2020-02-08 MED ORDER — BISACODYL 10 MG RE SUPP: 10.0000 mg | Freq: Every day | RECTAL | Status: DC | PRN

## 2020-02-08 MED ORDER — ENSURE ENLIVE PO LIQD
296.0000 mL | Freq: Once | ORAL | Status: AC
  Administered 2020-02-08: 296 mL via ORAL

## 2020-02-08 MED ORDER — PROPOFOL 500 MG/50ML IV EMUL
INTRAVENOUS | Status: DC | PRN
  Administered 2020-02-08: 50 ug/kg/min via INTRAVENOUS

## 2020-02-08 MED ORDER — LOSARTAN POTASSIUM 25 MG PO TABS
25.0000 mg | ORAL_TABLET | Freq: Every day | ORAL | Status: DC
  Administered 2020-02-08 – 2020-02-12 (×4): 25 mg via ORAL
  Filled 2020-02-08 (×5): qty 1

## 2020-02-08 MED ORDER — TRANEXAMIC ACID-NACL 1000-0.7 MG/100ML-% IV SOLN
INTRAVENOUS | Status: AC
  Administered 2020-02-08: 1000 mg via INTRAVENOUS
  Filled 2020-02-08: qty 100

## 2020-02-08 MED ORDER — CEFAZOLIN SODIUM-DEXTROSE 2-4 GM/100ML-% IV SOLN
2.0000 g | Freq: Four times a day (QID) | INTRAVENOUS | Status: AC
  Administered 2020-02-08 – 2020-02-09 (×4): 2 g via INTRAVENOUS
  Filled 2020-02-08 (×4): qty 100

## 2020-02-08 MED ORDER — CEFAZOLIN SODIUM 1 G IJ SOLR
INTRAMUSCULAR | Status: AC
  Filled 2020-02-08: qty 20

## 2020-02-08 MED ORDER — BUPIVACAINE LIPOSOME 1.3 % IJ SUSP
INTRAMUSCULAR | Status: AC
  Filled 2020-02-08: qty 20

## 2020-02-08 MED ORDER — ONDANSETRON HCL 4 MG/2ML IJ SOLN
INTRAMUSCULAR | Status: DC | PRN
  Administered 2020-02-08: 4 mg via INTRAVENOUS

## 2020-02-08 MED ORDER — CEFAZOLIN SODIUM-DEXTROSE 2-4 GM/100ML-% IV SOLN
2.0000 g | INTRAVENOUS | Status: AC
  Administered 2020-02-08 (×2): 2 g via INTRAVENOUS

## 2020-02-08 MED ORDER — TRANEXAMIC ACID-NACL 1000-0.7 MG/100ML-% IV SOLN: 1000.0000 mg | Freq: Once | INTRAVENOUS | Status: AC

## 2020-02-08 MED ORDER — CELECOXIB 200 MG PO CAPS
ORAL_CAPSULE | ORAL | Status: AC
  Administered 2020-02-08: 400 mg via ORAL
  Filled 2020-02-08: qty 2

## 2020-02-08 MED ORDER — OXYBUTYNIN CHLORIDE 5 MG PO TABS
5.0000 mg | ORAL_TABLET | Freq: Every day | ORAL | Status: DC
  Administered 2020-02-08 – 2020-02-12 (×5): 5 mg via ORAL
  Filled 2020-02-08 (×5): qty 1

## 2020-02-08 MED ORDER — DORZOLAMIDE HCL-TIMOLOL MAL 2-0.5 % OP SOLN
1.0000 [drp] | Freq: Two times a day (BID) | OPHTHALMIC | Status: DC
  Administered 2020-02-08 – 2020-02-12 (×8): 1 [drp] via OPHTHALMIC
  Filled 2020-02-08: qty 10

## 2020-02-08 MED ORDER — BUPIVACAINE HCL (PF) 0.5 % IJ SOLN
INTRAMUSCULAR | Status: DC | PRN
  Administered 2020-02-08: 3 mL via INTRATHECAL

## 2020-02-08 MED ORDER — GABAPENTIN 300 MG PO CAPS: 300.0000 mg | ORAL_CAPSULE | Freq: Once | ORAL | Status: AC

## 2020-02-08 MED ORDER — NEOMYCIN-POLYMYXIN B GU 40-200000 IR SOLN
Status: AC
  Filled 2020-02-08: qty 20

## 2020-02-08 MED ORDER — DEXAMETHASONE SODIUM PHOSPHATE 10 MG/ML IJ SOLN
INTRAMUSCULAR | Status: AC
  Filled 2020-02-08: qty 1

## 2020-02-08 MED ORDER — PHENYLEPHRINE HCL (PRESSORS) 10 MG/ML IV SOLN
INTRAVENOUS | Status: DC | PRN
  Administered 2020-02-08: 50 ug via INTRAVENOUS
  Administered 2020-02-08: 100 ug via INTRAVENOUS
  Administered 2020-02-08: 50 ug via INTRAVENOUS
  Administered 2020-02-08: 100 ug via INTRAVENOUS

## 2020-02-08 MED ORDER — GABAPENTIN 300 MG PO CAPS
ORAL_CAPSULE | ORAL | Status: AC
  Administered 2020-02-08: 300 mg via ORAL
  Filled 2020-02-08: qty 1

## 2020-02-08 MED ORDER — FERROUS SULFATE 325 (65 FE) MG PO TABS
325.0000 mg | ORAL_TABLET | Freq: Two times a day (BID) | ORAL | Status: DC
  Administered 2020-02-09 – 2020-02-12 (×7): 325 mg via ORAL
  Filled 2020-02-08 (×7): qty 1

## 2020-02-08 MED ORDER — MENTHOL 3 MG MT LOZG
1.0000 | LOZENGE | OROMUCOSAL | Status: DC | PRN
  Filled 2020-02-08: qty 9

## 2020-02-08 MED ORDER — CHLORHEXIDINE GLUCONATE 0.12 % MT SOLN: 15.0000 mL | Freq: Once | OROMUCOSAL | Status: AC

## 2020-02-08 MED ORDER — PHENYLEPHRINE HCL (PRESSORS) 10 MG/ML IV SOLN
INTRAVENOUS | Status: AC
  Filled 2020-02-08: qty 1

## 2020-02-08 MED ORDER — FENTANYL CITRATE (PF) 100 MCG/2ML IJ SOLN
INTRAMUSCULAR | Status: AC
  Administered 2020-02-08: 50 ug via INTRAVENOUS
  Filled 2020-02-08: qty 2

## 2020-02-08 MED ORDER — OXYCODONE HCL 5 MG PO TABS
10.0000 mg | ORAL_TABLET | ORAL | Status: DC | PRN
  Administered 2020-02-08 – 2020-02-12 (×4): 10 mg via ORAL
  Filled 2020-02-08 (×4): qty 2

## 2020-02-08 MED ORDER — FLUTICASONE PROPIONATE 50 MCG/ACT NA SUSP
1.0000 | Freq: Every day | NASAL | Status: DC
  Administered 2020-02-08 – 2020-02-12 (×5): 1 via NASAL
  Filled 2020-02-08: qty 16

## 2020-02-08 MED ORDER — ONDANSETRON HCL 4 MG/2ML IJ SOLN
4.0000 mg | Freq: Four times a day (QID) | INTRAMUSCULAR | Status: DC | PRN
  Administered 2020-02-08: 4 mg via INTRAVENOUS
  Filled 2020-02-08: qty 2

## 2020-02-08 MED ORDER — LIDOCAINE HCL (CARDIAC) PF 100 MG/5ML IV SOSY
PREFILLED_SYRINGE | INTRAVENOUS | Status: DC | PRN
  Administered 2020-02-08: 60 mg via INTRAVENOUS

## 2020-02-08 MED ORDER — PROPOFOL 10 MG/ML IV BOLUS
INTRAVENOUS | Status: DC | PRN
  Administered 2020-02-08 (×3): 20 mg via INTRAVENOUS
  Administered 2020-02-08: 30 mg via INTRAVENOUS

## 2020-02-08 MED ORDER — CEFAZOLIN SODIUM-DEXTROSE 2-4 GM/100ML-% IV SOLN
INTRAVENOUS | Status: AC
  Filled 2020-02-08: qty 100

## 2020-02-08 MED ORDER — ADULT MULTIVITAMIN W/MINERALS CH
1.0000 | ORAL_TABLET | Freq: Every day | ORAL | Status: DC
  Administered 2020-02-09 – 2020-02-12 (×4): 1 via ORAL
  Filled 2020-02-08 (×4): qty 1

## 2020-02-08 MED ORDER — DIPHENHYDRAMINE HCL 12.5 MG/5ML PO ELIX: 12.5000 mg | ORAL_SOLUTION | ORAL | Status: DC | PRN

## 2020-02-08 MED ORDER — LACTATED RINGERS IV SOLN: INTRAVENOUS | Status: DC

## 2020-02-08 MED ORDER — PANTOPRAZOLE SODIUM 40 MG PO TBEC
40.0000 mg | DELAYED_RELEASE_TABLET | Freq: Two times a day (BID) | ORAL | Status: DC
  Administered 2020-02-08 – 2020-02-12 (×8): 40 mg via ORAL
  Filled 2020-02-08 (×8): qty 1

## 2020-02-08 MED ORDER — CHLORHEXIDINE GLUCONATE 0.12 % MT SOLN
OROMUCOSAL | Status: AC
  Administered 2020-02-08: 15 mL via OROMUCOSAL
  Filled 2020-02-08: qty 15

## 2020-02-08 MED ORDER — CELECOXIB 200 MG PO CAPS
200.0000 mg | ORAL_CAPSULE | Freq: Two times a day (BID) | ORAL | Status: DC
  Administered 2020-02-08 – 2020-02-12 (×8): 200 mg via ORAL
  Filled 2020-02-08 (×8): qty 1

## 2020-02-08 MED ORDER — ORAL CARE MOUTH RINSE: 15.0000 mL | Freq: Once | OROMUCOSAL | Status: AC

## 2020-02-08 MED ORDER — PHENOL 1.4 % MT LIQD
1.0000 | OROMUCOSAL | Status: DC | PRN
  Filled 2020-02-08: qty 177

## 2020-02-08 MED ORDER — ENOXAPARIN SODIUM 30 MG/0.3ML ~~LOC~~ SOLN
30.0000 mg | Freq: Two times a day (BID) | SUBCUTANEOUS | Status: DC
  Administered 2020-02-09 – 2020-02-12 (×6): 30 mg via SUBCUTANEOUS
  Filled 2020-02-08 (×7): qty 0.3

## 2020-02-08 MED ORDER — DEXAMETHASONE SODIUM PHOSPHATE 10 MG/ML IJ SOLN
INTRAMUSCULAR | Status: DC | PRN
  Administered 2020-02-08: 8 mg via INTRAVENOUS

## 2020-02-08 MED ORDER — CALCIUM CITRATE-VITAMIN D 500-500 MG-UNIT PO CHEW
1.0000 | CHEWABLE_TABLET | Freq: Every day | ORAL | Status: DC
  Administered 2020-02-09 – 2020-02-12 (×4): 1 via ORAL
  Filled 2020-02-08 (×4): qty 1

## 2020-02-08 MED ORDER — LIDOCAINE HCL (PF) 2 % IJ SOLN
INTRAMUSCULAR | Status: AC
  Filled 2020-02-08: qty 5

## 2020-02-08 MED ORDER — ACETAMINOPHEN 10 MG/ML IV SOLN
1000.0000 mg | Freq: Four times a day (QID) | INTRAVENOUS | Status: AC
  Administered 2020-02-08 – 2020-02-09 (×4): 1000 mg via INTRAVENOUS
  Filled 2020-02-08 (×4): qty 100

## 2020-02-08 MED ORDER — TRAMADOL HCL 50 MG PO TABS
50.0000 mg | ORAL_TABLET | ORAL | Status: DC | PRN
  Administered 2020-02-11 – 2020-02-12 (×2): 100 mg via ORAL
  Filled 2020-02-08 (×2): qty 2

## 2020-02-08 MED ORDER — GABAPENTIN 300 MG PO CAPS
300.0000 mg | ORAL_CAPSULE | Freq: Every day | ORAL | Status: DC
  Administered 2020-02-08 – 2020-02-11 (×4): 300 mg via ORAL
  Filled 2020-02-08 (×4): qty 1

## 2020-02-08 MED ORDER — BUPIVACAINE HCL (PF) 0.25 % IJ SOLN
INTRAMUSCULAR | Status: AC
  Filled 2020-02-08: qty 60

## 2020-02-08 MED ORDER — ACETAMINOPHEN 500 MG PO TABS
ORAL_TABLET | ORAL | Status: AC
  Filled 2020-02-08: qty 2

## 2020-02-08 MED ORDER — FAMOTIDINE 20 MG PO TABS
ORAL_TABLET | ORAL | Status: AC
  Administered 2020-02-08: 20 mg via ORAL
  Filled 2020-02-08: qty 1

## 2020-02-08 MED ORDER — METOCLOPRAMIDE HCL 10 MG PO TABS
10.0000 mg | ORAL_TABLET | Freq: Three times a day (TID) | ORAL | Status: AC
  Administered 2020-02-08 – 2020-02-10 (×8): 10 mg via ORAL
  Filled 2020-02-08 (×8): qty 1

## 2020-02-08 MED ORDER — OXYCODONE HCL 5 MG PO TABS: 5.0000 mg | ORAL_TABLET | Freq: Once | ORAL | Status: DC | PRN

## 2020-02-08 MED ORDER — OXYCODONE HCL 5 MG/5ML PO SOLN: 5.0000 mg | Freq: Once | ORAL | Status: DC | PRN

## 2020-02-08 MED ORDER — SODIUM CHLORIDE FLUSH 0.9 % IV SOLN
INTRAVENOUS | Status: AC
  Filled 2020-02-08: qty 40

## 2020-02-08 MED ORDER — CHLORHEXIDINE GLUCONATE 4 % EX LIQD
60.0000 mL | Freq: Once | CUTANEOUS | Status: AC
  Administered 2020-02-08: 4 via TOPICAL

## 2020-02-08 SURGICAL SUPPLY — 85 items
BATTERY INSTRU NAVIGATION (MISCELLANEOUS) ×16 IMPLANT
BIT DRILL 2.5X110 QC LCP DISP (BIT) ×2 IMPLANT
BLADE SAW 70X12.5 (BLADE) ×4 IMPLANT
BLADE SAW 90X13X1.19 OSCILLAT (BLADE) ×4 IMPLANT
BLADE SAW 90X25X1.19 OSCILLAT (BLADE) ×4 IMPLANT
CANISTER SUCT 3000ML PPV (MISCELLANEOUS) ×4 IMPLANT
CEMENT HV SMART SET (Cement) ×4 IMPLANT
CEMENT TIBIA MBT SIZE 2.5 (Knees) IMPLANT
COOLER POLAR GLACIER W/PUMP (MISCELLANEOUS) ×4 IMPLANT
COVER WAND RF STERILE (DRAPES) ×4 IMPLANT
CUFF TOURN SGL QUICK 24 (TOURNIQUET CUFF)
CUFF TOURN SGL QUICK 30 (TOURNIQUET CUFF) ×2
CUFF TRNQT CYL 24X4X16.5-23 (TOURNIQUET CUFF) IMPLANT
CUFF TRNQT CYL 30X4X21-28X (TOURNIQUET CUFF) IMPLANT
DRAPE 3/4 80X56 (DRAPES) ×4 IMPLANT
DRAPE C-ARM XRAY 36X54 (DRAPES) ×2 IMPLANT
DRSG DERMACEA 8X12 NADH (GAUZE/BANDAGES/DRESSINGS) ×4 IMPLANT
DRSG OPSITE POSTOP 4X14 (GAUZE/BANDAGES/DRESSINGS) ×4 IMPLANT
DRSG TEGADERM 4X4.75 (GAUZE/BANDAGES/DRESSINGS) ×4 IMPLANT
DURAPREP 26ML APPLICATOR (WOUND CARE) ×8 IMPLANT
ELECT REM PT RETURN 9FT ADLT (ELECTROSURGICAL) ×4
ELECTRODE REM PT RTRN 9FT ADLT (ELECTROSURGICAL) ×2 IMPLANT
EX-PIN ORTHOLOCK NAV 4X150 (PIN) ×8 IMPLANT
FEMUR SIGMA PS SZ 2.5 R (Femur) ×2 IMPLANT
GLOVE BIO SURGEON STRL SZ7.5 (GLOVE) ×8 IMPLANT
GLOVE BIOGEL M STRL SZ7.5 (GLOVE) ×8 IMPLANT
GLOVE BIOGEL PI IND STRL 7.5 (GLOVE) ×2 IMPLANT
GLOVE BIOGEL PI INDICATOR 7.5 (GLOVE) ×2
GLOVE INDICATOR 8.0 STRL GRN (GLOVE) ×4 IMPLANT
GOWN STRL REUS W/ TWL LRG LVL3 (GOWN DISPOSABLE) ×4 IMPLANT
GOWN STRL REUS W/ TWL XL LVL3 (GOWN DISPOSABLE) ×2 IMPLANT
GOWN STRL REUS W/TWL LRG LVL3 (GOWN DISPOSABLE) ×4
GOWN STRL REUS W/TWL XL LVL3 (GOWN DISPOSABLE) ×2
HEMOVAC 400CC 10FR (MISCELLANEOUS) ×4 IMPLANT
HOLDER FOLEY CATH W/STRAP (MISCELLANEOUS) ×4 IMPLANT
HOOD PEEL AWAY FLYTE STAYCOOL (MISCELLANEOUS) ×8 IMPLANT
INSERT PFC SIG STB SZ 2.5 15.5 (Knees) ×2 IMPLANT
K-WIRE 1.25 TRCR POINT 150 (WIRE) ×4
KIT TURNOVER KIT A (KITS) ×4 IMPLANT
KNIFE SCULPS 14X20 (INSTRUMENTS) ×4 IMPLANT
KWIRE 1.25 TRCR POINT 150 (WIRE) IMPLANT
LABEL OR SOLS (LABEL) ×4 IMPLANT
MANIFOLD NEPTUNE II (INSTRUMENTS) ×4 IMPLANT
NDL SAFETY ECLIPSE 18X1.5 (NEEDLE) ×2 IMPLANT
NDL SPNL 20GX3.5 QUINCKE YW (NEEDLE) ×4 IMPLANT
NEEDLE HYPO 18GX1.5 SHARP (NEEDLE) ×2
NEEDLE SPNL 20GX3.5 QUINCKE YW (NEEDLE) ×8 IMPLANT
NS IRRIG 500ML POUR BTL (IV SOLUTION) ×4 IMPLANT
PACK TOTAL KNEE (MISCELLANEOUS) ×4 IMPLANT
PAD WRAPON POLAR KNEE (MISCELLANEOUS) ×2 IMPLANT
PATELLA DOME PFC 35MM (Knees) ×2 IMPLANT
PENCIL SMOKE EVACUATOR COATED (MISCELLANEOUS) ×4 IMPLANT
PENCIL SMOKE ULTRAEVAC 22 CON (MISCELLANEOUS) ×2 IMPLANT
PIN DRILL QUICK PACK ×4 IMPLANT
PIN FIXATION 1/8DIA X 3INL (PIN) ×12 IMPLANT
PLATE SM RT 3.5X4X78M (Plate) ×2 IMPLANT
PULSAVAC PLUS IRRIG FAN TIP (DISPOSABLE) ×4
SCREW CORTEX 3.5 32MM (Screw) ×2 IMPLANT
SCREW CORTEX 3.5 34MM (Screw) ×4 IMPLANT
SCREW CORTEX 3.5 38MM (Screw) ×2 IMPLANT
SCREW LOCK CORT ST 3.5X32 (Screw) IMPLANT
SCREW LOCK CORT ST 3.5X34 (Screw) IMPLANT
SCREW LOCK CORT ST 3.5X38 (Screw) IMPLANT
SOL .9 NS 3000ML IRR  AL (IV SOLUTION) ×2
SOL .9 NS 3000ML IRR UROMATIC (IV SOLUTION) ×2 IMPLANT
SOL PREP PVP 2OZ (MISCELLANEOUS) ×4
SOLUTION PREP PVP 2OZ (MISCELLANEOUS) ×2 IMPLANT
SPONGE DRAIN TRACH 4X4 STRL 2S (GAUZE/BANDAGES/DRESSINGS) ×4 IMPLANT
SPONGE LAP 18X18 RF (DISPOSABLE) ×2 IMPLANT
STAPLER SKIN PROX 35W (STAPLE) ×4 IMPLANT
STOCKINETTE IMPERV 14X48 (MISCELLANEOUS) ×2 IMPLANT
STRAP TIBIA SHORT (MISCELLANEOUS) ×4 IMPLANT
SUCTION FRAZIER HANDLE 10FR (MISCELLANEOUS) ×2
SUCTION TUBE FRAZIER 10FR DISP (MISCELLANEOUS) ×2 IMPLANT
SUT VIC AB 0 CT1 36 (SUTURE) ×8 IMPLANT
SUT VIC AB 1 CT1 36 (SUTURE) ×10 IMPLANT
SUT VIC AB 2-0 CT2 27 (SUTURE) ×4 IMPLANT
SYR 20ML LL LF (SYRINGE) ×4 IMPLANT
SYR 30ML LL (SYRINGE) ×8 IMPLANT
TIBIA MBT CEMENT SIZE 2.5 (Knees) ×4 IMPLANT
TIP FAN IRRIG PULSAVAC PLUS (DISPOSABLE) ×2 IMPLANT
TOWEL OR 17X26 4PK STRL BLUE (TOWEL DISPOSABLE) ×4 IMPLANT
TOWER CARTRIDGE SMART MIX (DISPOSABLE) ×4 IMPLANT
TRAY FOLEY MTR SLVR 16FR STAT (SET/KITS/TRAYS/PACK) ×4 IMPLANT
WRAPON POLAR PAD KNEE (MISCELLANEOUS) ×4

## 2020-02-08 NOTE — Anesthesia Postprocedure Evaluation (Signed)
Anesthesia Post Note  Patient: Linda Ibarra  Procedure(s) Performed: COMPUTER ASSISTED TOTAL KNEE ARTHROPLASTY  (Right Knee) OPEN REDUCTION INTERNAL FIXATION (ORIF) TIBIAL PLATEAU (Right )  Anesthesia Type: Combined General/Spinal   No complications documented.   Last Vitals:  Vitals:   02/08/20 1348 02/08/20 1408  BP:  (!) 110/58  Pulse:  66  Resp:    Temp: (!) 36.4 C 36.5 C  SpO2:  100%    Last Pain:  Vitals:   02/08/20 1408  TempSrc: Oral  PainSc:                  Corinda Gubler

## 2020-02-08 NOTE — Plan of Care (Signed)
  Problem: Education: Goal: Knowledge of General Education information will improve Description: Including pain rating scale, medication(s)/side effects and non-pharmacologic comfort measures Outcome: Progressing   Problem: Clinical Measurements: Goal: Respiratory complications will improve Outcome: Progressing Goal: Cardiovascular complication will be avoided Outcome: Progressing   Problem: Coping: Goal: Level of anxiety will decrease Outcome: Progressing   Problem: Pain Managment: Goal: General experience of comfort will improve Outcome: Progressing   

## 2020-02-08 NOTE — Progress Notes (Signed)
   02/08/20 0710  Clinical Encounter Type  Visited With Patient;Family  Visit Type Initial  Referral From Chaplain  Consult/Referral To Chaplain  While rounding in [place], chaplain briefly visited with patient. Patient told chaplain that she is prayed up, but told chaplain that she could also pray. Chaplain prayed with patient and told her that she will check in on her after she is on the floor. Patient told chaplain that her husband was in the waiting area and chaplain told patient she would check on him.  While rounding the [facility] waiting area, chaplain met patient's husband and told him that she had spoken with patient and told her she would check on him. Patient's husband said he was glad that to hear she had spoken with his wife. Chaplain told him she will check on them later.

## 2020-02-08 NOTE — Anesthesia Preprocedure Evaluation (Signed)
Anesthesia Evaluation  Patient identified by MRN, date of birth, ID band Patient awake    Reviewed: Allergy & Precautions, NPO status , Patient's Chart, lab work & pertinent test results  History of Anesthesia Complications Negative for: history of anesthetic complications  Airway Mallampati: II  TM Distance: >3 FB Neck ROM: Full    Dental no notable dental hx. (+) Partial Upper, Dental Advisory Given   Pulmonary neg pulmonary ROS, neg sleep apnea, neg COPD, Patient abstained from smoking.Not current smoker,    Pulmonary exam normal breath sounds clear to auscultation       Cardiovascular Exercise Tolerance: Good METShypertension, Pt. on medications (-) CAD and (-) Past MI (-) dysrhythmias  Rhythm:Regular Rate:Normal - Systolic murmurs    Neuro/Psych negative neurological ROS  negative psych ROS   GI/Hepatic negative GI ROS, Neg liver ROS, neg GERD  ,  Endo/Other  negative endocrine ROSneg diabetes  Renal/GU Renal InsufficiencyRenal disease     Musculoskeletal  (+) Arthritis ,   Abdominal   Peds  Hematology  (+) anemia ,   Anesthesia Other Findings Past Medical History: No date: Anemia No date: Arthritis No date: H/O seasonal allergies No date: Hypertension  Reproductive/Obstetrics                             Anesthesia Physical  Anesthesia Plan  ASA: II  Anesthesia Plan: General/Spinal   Post-op Pain Management:    Induction: Intravenous  PONV Risk Score and Plan: 4 or greater and Ondansetron, Dexamethasone, Propofol infusion and TIVA  Airway Management Planned: Nasal Cannula and Natural Airway  Additional Equipment: None  Intra-op Plan:   Post-operative Plan:   Informed Consent: I have reviewed the patients History and Physical, chart, labs and discussed the procedure including the risks, benefits and alternatives for the proposed anesthesia with the patient or  authorized representative who has indicated his/her understanding and acceptance.       Plan Discussed with: CRNA and Surgeon  Anesthesia Plan Comments: (Discussed R/B/A of neuraxial anesthesia technique with patient: - rare risks of spinal/epidural hematoma, nerve damage, infection - Risk of PDPH - Risk of nausea and vomiting - Risk of conversion to general anesthesia and its associated risks, including sore throat, damage to lips/teeth/oropharynx, and rare risks such as cardiac and respiratory events.  Patient voiced understanding.)        Anesthesia Quick Evaluation

## 2020-02-08 NOTE — Anesthesia Procedure Notes (Signed)
Spinal  Start time: 02/08/2020 7:30 AM End time: 02/08/2020 7:34 AM Staffing Performed: resident/CRNA  Anesthesiologist: Corinda Gubler, MD Resident/CRNA: Dava Najjar, CRNA Preanesthetic Checklist Completed: patient identified, IV checked, site marked, risks and benefits discussed, surgical consent, monitors and equipment checked, pre-op evaluation and timeout performed Spinal Block Patient position: sitting Prep: ChloraPrep and site prepped and draped Patient monitoring: heart rate, continuous pulse ox and blood pressure Approach: midline Location: L4-5 Injection technique: single-shot Needle Needle type: Pencan  Needle gauge: 22 G Needle length: 10 cm Assessment Sensory level: T6

## 2020-02-08 NOTE — Evaluation (Signed)
Physical Therapy Evaluation Patient Details Name: Linda Ibarra MRN: [id number] DOB: 25-Sep-1940 Today's Date: 02/08/2020   History of Present Illness  Pt is a 79 year old female w/ PMH of HTN, renal insufficiency, renal disease, arthritis, anemia, lumbar fusion, and L TKA in 2017. Per MD impression, pt presents with primary degenerative arthrosis of R knee as well as intraoperative findings of R medial tibial plateau fracture. Pt is now s/p R TKA and R ORIF of the medial tibial plateau.  Clinical Impression  Pt pleasant and motivated to participate during the session. Pt R knee AROM this session measured initially as 0-70deg. Pt reported high initial pain as 9/10, RN came in and gave pain meds shortly after. Pt able to perform supine and sitting exercises w/ minimal instructional cueing; exercise included 10x SLR on the RLE, so pt does not require KI. Pt demonstrated mod-I w/ bed mobility and required extra time and effort. Pt reported mild dizziness once in sitting; vitals were checked and recorded as HR 69bpm, SpO2 100%, and BP 158/93. CGA provided in sitting until pt reported resolution of sx. Pt given instruction on how to perform flat foot TDWB. With sit-to-stand, pt placed RLE on therapist foot to ensure weight bearing compliance. Pt required min-A to initiate movement, but after that pt demonstrated good ability to shift weight through LLE and BUE. Pt instructed to lift LLE while complying with weight bearing precautions, with RLE still on therapist foot and placed all weight through BUE. Pt successful with this x2 and then repeated without therapist foot x2. Pt able to ambulate ~67ft forwards in backwards with small, step-to, steps while adhering to WB precautions. Pt required significantly increased effort to ambulate this distance with flat foot TDWB precautions. Pt given education on HEP packet and exercise frequency. Pt will benefit from PT services in a SNF setting upon discharge to safely  address deficits listed in patient problem list for decreased caregiver assistance and eventual return to PLOF.     Follow Up Recommendations SNF    Equipment Recommendations  None recommended by PT    Recommendations for Other Services       Precautions / Restrictions Precautions Precautions: Fall Precaution Comments: Pt was able to perform R SLR x10: no KI required Restrictions Weight Bearing Restrictions: Yes RLE Weight Bearing: Touchdown weight bearing Other Position/Activity Restrictions: Flat foot weight bearing      Mobility  Bed Mobility Overal bed mobility: Modified Independent             General bed mobility comments: increased time and effort; use of handrails, no physical assist required  Transfers Overall transfer level: Needs assistance Equipment used: Rolling walker (2 wheeled) Transfers: Sit to/from Stand Sit to Stand: From elevated surface;Min assist         General transfer comment: Pt RLE placed on therapist foot to ensure no weight placed through RLE. Min assist provided to help initiate movement; pt able to complete movement w/ LLE and BUE support  Ambulation/Gait Ambulation/Gait assistance: Min assist Gait Distance (Feet): 2 Feet Assistive device: Rolling walker (2 wheeled) Gait Pattern/deviations: Step-to pattern;Decreased step length - left;Decreased stance time - right Gait velocity: decreased   General Gait Details: Pt able to take 2x small steps forward and backward with a step-to pattern and flat foot TDWB on RLE; pt compliant w/ WB status. Pt demonstrated good ability to bear weight through BUE and LLE.  Stairs            Wheelchair  Mobility    Modified Rankin (Stroke Patients Only)       Balance Overall balance assessment: Needs assistance Sitting-balance support: Single extremity supported;Bilateral upper extremity supported;Feet unsupported Sitting balance-Leahy Scale: Good Sitting balance - Comments: Pt reported  feeling dizzy once in sitting; CGA provided until resolution of sx. No LOB noted and pt able to maintain sitting w/ UE support     Standing balance-Leahy Scale: Fair Standing balance comment: Pt demonstrated good ability to bear weight through BUE and LLE while adhering to WB precautions                         Pertinent Vitals/Pain Pain Assessment: 0-10 Pain Score: 9  Pain Location: R knee Pain Descriptors / Indicators: Sore;Aching Pain Intervention(s): Limited activity within patient's tolerance;Monitored during session;Premedicated before session;Repositioned;RN gave pain meds during session;Ice applied    Home Living Family/patient expects to be discharged to:: Private residence Living Arrangements: Spouse/significant other Available Help at Discharge: Family;Available 24 hours/day Type of Home: House Home Access: Level entry (pt reports single stair to enter kitchen)     Home Layout: One level Home Equipment: Walker - 2 wheels;Cane - single point;Bedside commode;Shower seat;Toilet riser;Grab bars - tub/shower Additional Comments: pt states small step to enter shower    Prior Function Level of Independence: Independent with assistive device(s)         Comments: Pt reports household ambulation w/ no AD; community ambulation w/ SPC; independent w/ ADLs/IADLs; pt states 2x falls in past year via being careless (missed a step and tripped)     Hand Dominance        Extremity/Trunk Assessment   Upper Extremity Assessment Upper Extremity Assessment: Overall WFL for tasks assessed    Lower Extremity Assessment Lower Extremity Assessment: RLE deficits/detail;LLE deficits/detail RLE Deficits / Details: BLE ankle AROM and strength grossly WFL; RLE hip flexion >/= 3/5 RLE Sensation: WNL LLE Deficits / Details: grossly WFL LLE Sensation: WNL       Communication   Communication: No difficulties  Cognition Arousal/Alertness: Awake/alert Behavior During Therapy:  WFL for tasks assessed/performed Overall Cognitive Status: Within Functional Limits for tasks assessed                                 General Comments: A&O x4      General Comments      Exercises Total Joint Exercises Ankle Circles/Pumps: AROM;Both;10 reps Quad Sets: Strengthening;Both;10 reps Gluteal Sets: Strengthening;Both;10 reps Hip ABduction/ADduction: AROM;Strengthening;Right;10 reps Straight Leg Raises: AROM;Strengthening;Right;10 reps Long Arc Quad: AROM;Strengthening;Right;10 reps Knee Flexion: AROM;Strengthening;Right;10 reps Goniometric ROM: R knee AROM: 0-70deg Marching in Standing: AROM;Left;Standing (2; compliant w/ flat foot WB on RLE) Other Exercises Other Exercises: Education on gait sequencing and foot flat TDWB status Other Exercises: Education on HEP packet and exercise frequency   Assessment/Plan    PT Assessment Patient needs continued PT services  PT Problem List Decreased strength;Decreased range of motion;Decreased activity tolerance;Decreased balance;Decreased mobility;Decreased knowledge of use of DME;Pain       PT Treatment Interventions DME instruction;Gait training;Functional mobility training;Therapeutic activities;Therapeutic exercise;Balance training;Patient/family education    PT Goals (Current goals can be found in the Care Plan section)  Acute Rehab PT Goals Patient Stated Goal: to get better and go home PT Goal Formulation: With patient Time For Goal Achievement: 02/22/20 Potential to Achieve Goals: Good    Frequency BID   Barriers to discharge  Co-evaluation               AM-PAC PT "6 Clicks" Mobility  Outcome Measure Help needed turning from your back to your side while in a flat bed without using bedrails?: None Help needed moving from lying on your back to sitting on the side of a flat bed without using bedrails?: None Help needed moving to and from a bed to a chair (including a wheelchair)?: A  Lot Help needed standing up from a chair using your arms (e.g., wheelchair or bedside chair)?: A Little Help needed to walk in hospital room?: Total Help needed climbing 3-5 steps with a railing? : Total 6 Click Score: 15    End of Session Equipment Utilized During Treatment: Gait belt Activity Tolerance: Patient tolerated treatment well Patient left: in bed;with call bell/phone within reach;with bed alarm set;with SCD's reapplied Nurse Communication: Mobility status;Weight bearing status; pt left on room air (OK per nursing) PT Visit Diagnosis: Unsteadiness on feet (R26.81);Other abnormalities of gait and mobility (R26.89);Muscle weakness (generalized) (M62.81);History of falling (Z91.81);Pain Pain - Right/Left: Right Pain - part of body: Knee    Time: 2446-2863 PT Time Calculation (min) (ACUTE ONLY): 46 min   Charges:              Breauna Mazzeo SPT 02/08/20, 5:50 PM

## 2020-02-08 NOTE — H&P (Signed)
The patient has been re-examined, and the chart reviewed, and there have been no interval changes to the documented history and physical.    The risks, benefits, and alternatives have been discussed at length. The patient expressed understanding of the risks benefits and agreed with plans for surgical intervention.  Josslin Sanjuan P. Natacia Chaisson, Jr. M.D.    

## 2020-02-08 NOTE — Transfer of Care (Signed)
Immediate Anesthesia Transfer of Care Note  Patient: Linda Ibarra  Procedure(s) Performed: COMPUTER ASSISTED TOTAL KNEE ARTHROPLASTY  (Right Knee) OPEN REDUCTION INTERNAL FIXATION (ORIF) TIBIAL PLATEAU (Right )  Patient Location: PACU  Anesthesia Type:Spinal  Level of Consciousness: awake, alert , oriented and patient cooperative  Airway & Oxygen Therapy: Patient Spontanous Breathing and Patient connected to face mask oxygen  Post-op Assessment: Report given to RN and Post -op Vital signs reviewed and stable  Post vital signs: Reviewed and stable  Last Vitals:  Vitals Value Taken Time  BP 105/57 02/08/20 1248  Temp 36.7 C 02/08/20 1248  Pulse 95 02/08/20 1249  Resp 23 02/08/20 1249  SpO2 100 % 02/08/20 1249  Vitals shown include unvalidated device data.  Last Pain:  Vitals:   02/08/20 0649  TempSrc: Temporal  PainSc: 2          Complications: No complications documented.

## 2020-02-08 NOTE — Op Note (Signed)
OPERATIVE NOTE  DATE OF SURGERY:  02/08/2020  PATIENT NAME:  Linda Ibarra   DOB: 09/02/1940  MRN: [id number]  PRE-OPERATIVE DIAGNOSIS: Degenerative arthrosis of the right knee, primary  POST-OPERATIVE DIAGNOSIS:  Same Intraoperative right medial tibial plateau fracture  PROCEDURE:  Right total knee arthroplasty using computer-assisted navigation Open reduction and internal fixation of the right medial tibial plateau fracture SURGEON:  Jena Gauss. M.D.  ASSISTANT: Baldwin Jamaica, PA-C (present and scrubbed throughout the case, critical for assistance with exposure, retraction, instrumentation, and closure)  ANESTHESIA: spinal  ESTIMATED BLOOD LOSS: 50 mL  FLUIDS REPLACED: 1400 mL of crystalloid  TOURNIQUET TIME: #1-120 minutes number 2-20 minutes  DRAINS: 2 medium Hemovac drains  SOFT TISSUE RELEASES: Anterior cruciate ligament, posterior cruciate ligament, deep medial collateral ligament, patellofemoral ligament  IMPLANTS UTILIZED: DePuy PFC Sigma size 2.5 posterior stabilized femoral component (cemented), size 2.5 MBT tibial component (cemented), 35 mm 3 peg oval dome patella (cemented), and a 15 mm stabilized rotating platform polyethylene insert.  Synthes 6-hole 3.5 mm T plate, 4 - 3.5 mm cortical screws  INDICATIONS FOR SURGERY: Linda Ibarra is a 79 y.o. year old female with a long history of progressive knee pain. X-rays demonstrated severe degenerative changes in tricompartmental fashion. The patient had not seen any significant improvement despite conservative nonsurgical intervention. After discussion of the risks and benefits of surgical intervention, the patient expressed understanding of the risks benefits and agree with plans for total knee arthroplasty.   The risks, benefits, and alternatives were discussed at length including but not limited to the risks of infection, bleeding, nerve injury, stiffness, blood clots, the need for revision surgery, cardiopulmonary  complications, among others, and they were willing to proceed.  PROCEDURE IN DETAIL: The patient was brought into the operating room and, after adequate spinal anesthesia was achieved, a tourniquet was placed on the patient's upper thigh. The patient's knee and leg were cleaned and prepped with alcohol and DuraPrep and draped in the usual sterile fashion. A "timeout" was performed as per usual protocol. The lower extremity was exsanguinated using an Esmarch, and the tourniquet was inflated to 300 mmHg. An anterior longitudinal incision was made followed by a standard mid vastus approach. The deep fibers of the medial collateral ligament were elevated in a subperiosteal fashion off of the medial flare of the tibia so as to maintain a continuous soft tissue sleeve. The patella was subluxed laterally and the patellofemoral ligament was incised. Inspection of the knee demonstrated severe degenerative changes with full-thickness loss of articular cartilage. Osteophytes were debrided using a rongeur. Anterior and posterior cruciate ligaments were excised. Two 4.0 mm Schanz pins were inserted in the femur and into the tibia for attachment of the array of trackers used for computer-assisted navigation. Hip center was identified using a circumduction technique. Distal landmarks were mapped using the computer. The distal femur and proximal tibia were mapped using the computer. The distal femoral cutting guide was positioned using computer-assisted navigation so as to achieve a 5 distal valgus cut. The femur was sized and it was felt that a size 2.5 femoral component was appropriate. A size 2.5 femoral cutting guide was positioned and the anterior cut was performed and verified using the computer. This was followed by completion of the posterior and chamfer cuts. Femoral cutting guide for the central box was then positioned in the center box cut was performed.  Attention was then directed to the proximal tibia. Medial and  lateral menisci were excised.  The extramedullary tibial cutting guide was positioned using computer-assisted navigation so as to achieve a 0 varus-valgus alignment and 0 posterior slope. The cut was performed and verified using the computer. The proximal tibia was sized and it was felt that a size 2.5 tibial tray was appropriate. Tibial and femoral trials were inserted followed by insertion of a 15 mm polyethylene insert. This allowed for excellent mediolateral soft tissue balancing both in flexion and in full extension. Finally, the patella was cut and prepared so as to accommodate a 35 mm 3 peg oval dome patella. A patella trial was placed and the knee was placed through a range of motion with excellent patellar tracking appreciated. The femoral trial was removed after debridement of posterior osteophytes. The central post-hole for the tibial component was reamed followed by insertion of a keel punch. Tibial trials were then removed. Cut surfaces of bone were irrigated with copious amounts of normal saline with antibiotic solution using pulsatile lavage and then suctioned dry.  At this point there was noted to be a fracture line extending through the medial tibial plateau with minimal displacement.  The fragment appeared to be hinged posteriorly with an incomplete fracture line anteriorly.  It was thus elected to proceed with open reduction and internal fixation of the fragment.  The initial incision was extended distally so as to better visualize the positioning of the plate.  A Synthes 6-hole 3.5 mm T plate was contoured to fit along the medial tibial plateau and form a buttress.  The plate was secured with a total of four 3.5 mm cortical screws distally.  This allowed for good compression at the fracture site.  The tourniquet had been initially deflated after initial tourniquet time of 120 minutes.  After adequate stability of the fracture was determined, trial components were reinserted and the right lower  extremity was exsanguinated using Esmarch and the tourniquet was reinflated to 300 mmHg.  A bone reduction forcep was used for additional compression at the fracture site during implantation.  Polymethylmethacrylate cement was prepared in the usual fashion using a vacuum mixer. Cement was applied to the cut surface of the proximal tibia as well as along the undersurface of a size 2.5 MBT tibial component. Tibial component was positioned and impacted into place. Excess cement was removed using Personal assistant. Cement was then applied to the cut surfaces of the femur as well as along the posterior flanges of the size 2.5 femoral component. The femoral component was positioned and impacted into place. Excess cement was removed using Personal assistant. A 15 mm polyethylene trial was inserted and the knee was brought into full extension with steady axial compression applied. Finally, cement was applied to the backside of a 35 mm 3 peg oval dome patella and the patellar component was positioned and patellar clamp applied. Excess cement was removed using Personal assistant. After adequate curing of the cement, the tourniquet was deflated after a second tourniquet time of 20 minutes. Hemostasis was achieved using electrocautery.  The previous fracture site was visualized and there was no widening of the fracture site.  C arm was brought into the operating room and the knee was visualized in both AP and lateral planes with good position of the implants and the buttress plate.  The fracture could not be seen on the intraoperative C arm images.  The knee was irrigated with copious amounts of normal saline with antibiotic solution using pulsatile lavage and then suctioned dry. 20 mL of 1.3% Exparel and 60  mL of 0.25% Marcaine in 40 mL of normal saline was injected along the posterior capsule, medial and lateral gutters, and along the arthrotomy site. A 15 mm stabilized rotating platform polyethylene insert was inserted and the knee  was placed through a range of motion with excellent mediolateral soft tissue balancing appreciated and excellent patellar tracking noted. 2 medium drains were placed in the wound bed and brought out through separate stab incisions. The medial parapatellar portion of the incision was reapproximated using interrupted sutures of #1 Vicryl. Subcutaneous tissue was approximated in layers using first #0 Vicryl followed #2-0 Vicryl. The skin was approximated with skin staples. A sterile dressing was applied.  The patient tolerated the procedure well and was transported to the recovery room in stable condition.    Cohen Boettner P. Angie Fava., M.D.

## 2020-02-09 ENCOUNTER — Encounter: Payer: Self-pay | Admitting: Orthopedic Surgery

## 2020-02-09 NOTE — Progress Notes (Signed)
Physical Therapy Treatment Patient Details Name: Linda Ibarra MRN: [id number] DOB: 1941-07-07 Today's Date: 02/09/2020    History of Present Illness Pt is a 79 year old female w/ PMH of HTN, renal insufficiency, renal disease, arthritis, anemia, lumbar fusion, and L TKA in 2017. Per MD impression, pt presents with primary degenerative arthrosis of R knee as well as intraoperative findings of R medial tibial plateau fracture. Pt is now s/p R TKA and R ORIF of the medial tibial plateau.    PT Comments    Pt pleasant and motivated to participate during the session. Pt reports no pain at beginning of session; stated recently received medications. Pt independent with sitting exercises w/ min instructional cueing. Pt compliant w/ WB restrictions throughout sit-to-stand transfer; pt instinctively kept RLE off of ground such that no weight was placed through leg. CGA provided for sit-to-stand this session; pt able to complete movement w/ just BUE and LLE support.  Pt eager to walk this session and able to walk forwards and backwards the length of bed w/ RW x2 with a rest between reps. CGA and mod cueing for sequencing required to maintain WB restrictions; therapist foot occasionally placed under pt RLE to ensure adherence to WB restrictions. Without cueing, pt would occasionally start to step out of sequence and required cueing to adjust. Pt acknowledged the tremendous BUE effort it took to ambulate a short distance with these restrictions; expressed understanding why further rehab would be beneficial. Pt HR and SpO2 WFL throughout session. Pt will benefit from PT services in a SNF setting upon discharge to safely address deficits listed in patient problem list for decreased caregiver assistance and eventual return to PLOF.   Follow Up Recommendations  SNF     Equipment Recommendations  None recommended by PT    Recommendations for Other Services       Precautions / Restrictions  Precautions Precautions: Fall Precaution Comments: Pt was able to perform R SLR x10: no KI required Restrictions Weight Bearing Restrictions: Yes RLE Weight Bearing: Touchdown weight bearing Other Position/Activity Restrictions: foot flat weight bearing    Mobility  Bed Mobility               General bed mobility comments: pt received and left up in chair   Transfers Overall transfer level: Needs assistance Equipment used: Rolling walker (2 wheeled) Transfers: Sit to/from Stand Sit to Stand: Min guard         General transfer comment: Min VCs to maintain TDWB precautions; pt instictively keeps RLE off ground during sit-to-stand so there is no weight going through extremity  Ambulation/Gait Ambulation/Gait assistance: Min guard Gait Distance (Feet): 15 Feet Assistive device: Rolling walker (2 wheeled) Gait Pattern/deviations: Step-to pattern;Decreased step length - left;Decreased stance time - right Gait velocity: decreased   General Gait Details: Pt found in chair. Able to walk forward and backward the length of the bed x2 w/ rest between reps. Pt required mod cueing to adhere to WB restrictions. Pt stated feeling more confident w/ backwards walking. Pt eager to walk but required significant UE effort to do so   Stairs             Wheelchair Mobility    Modified Rankin (Stroke Patients Only)       Balance Overall balance assessment: Needs assistance Sitting-balance support: No upper extremity supported;Feet supported Sitting balance-Leahy Scale: Good     Standing balance support: Bilateral upper extremity supported;During functional activity Standing balance-Leahy Scale: Fair Standing  balance comment: Pt demonstrated good ability to bear weight through BUE and LLE; compliant w/ WB status throughout.                            Cognition Arousal/Alertness: Awake/alert Behavior During Therapy: WFL for tasks assessed/performed Overall  Cognitive Status: Within Functional Limits for tasks assessed                                 General Comments: A&O x4      Exercises Total Joint Exercises Ankle Circles/Pumps: AROM;Both;10 reps Quad Sets: Strengthening;Both;10 reps Gluteal Sets: Strengthening;Both;10 reps Hip ABduction/ADduction: AROM;Strengthening;Right;10 reps Straight Leg Raises: AROM;Strengthening;Right;10 reps Long Arc Quad: AROM;Strengthening;Right;10 reps Knee Flexion: AROM;Strengthening;Right;10 reps Other Exercises     General Comments        Pertinent Vitals/Pain Pain Assessment: No/denies pain    Home Living Family/patient expects to be discharged to:: Private residence Living Arrangements: Spouse/significant other Available Help at Discharge: Family;Available 24 hours/day Type of Home: House Home Access: Level entry (pt reports single stair to enter kitchen)   Home Layout: One level Home Equipment: Walker - 2 wheels;Cane - single point;Bedside commode;Shower seat;Toilet riser;Grab bars - tub/shower;Adaptive equipment Additional Comments: pt states small step to enter shower    Prior Function Level of Independence: Independent with assistive device(s)      Comments: Pt reports household ambulation w/ no AD; community ambulation w/ SPC; independent w/ ADLs/IADLs; pt states 2x falls in past year via being careless (missed a step and tripped)   PT Goals (current goals can now be found in the care plan section) Acute Rehab PT Goals Patient Stated Goal: to get better and go home Progress towards PT goals: Progressing toward goals    Frequency    BID      PT Plan Current plan remains appropriate    Co-evaluation              AM-PAC PT "6 Clicks" Mobility   Outcome Measure  Help needed turning from your back to your side while in a flat bed without using bedrails?: None Help needed moving from lying on your back to sitting on the side of a flat bed without using  bedrails?: None Help needed moving to and from a bed to a chair (including a wheelchair)?: A Lot Help needed standing up from a chair using your arms (e.g., wheelchair or bedside chair)?: A Little Help needed to walk in hospital room?: A Lot Help needed climbing 3-5 steps with a railing? : Total 6 Click Score: 16    End of Session Equipment Utilized During Treatment: Gait belt Activity Tolerance: Patient tolerated treatment well Patient left: in chair;with call bell/phone within reach;with chair alarm set;with SCD's reapplied;Other (comment) (polarcare reapplied) Nurse Communication: Mobility status;Weight bearing status PT Visit Diagnosis: Unsteadiness on feet (R26.81);Other abnormalities of gait and mobility (R26.89);Muscle weakness (generalized) (M62.81);History of falling (Z91.81);Pain Pain - Right/Left: Right Pain - part of body: Knee     Time: 0932-6712 PT Time Calculation (min) (ACUTE ONLY): 30 min  Charges:                        Sully Manzi SPT 02/09/20, 4:00 PM

## 2020-02-09 NOTE — Evaluation (Addendum)
Occupational Therapy Evaluation Patient Details Name: Linda Ibarra MRN: [id number] DOB: 03/20/1941 Today's Date: 02/09/2020    History of Present Illness Pt is a 79 year old female w/ PMH of HTN, renal insufficiency, renal disease, arthritis, anemia, lumbar fusion, and L TKA in 2017. Per MD impression, pt presents with primary degenerative arthrosis of R knee as well as intraoperative findings of R medial tibial plateau fracture. Pt is now s/p R TKA and R ORIF of the medial tibial plateau.   Clinical Impression   Linda Ibarra was seen for OT evaluation this date, POD#1 from above surgery. Pt was independent in all ADLs prior to surgery. Pt is eager to return to PLOF with less pain and improved safety and independence. Pt presents to acute OT demonstrating impaired ADL performance and functional mobility 2/2 decreased LB access, functional strength/ROM/balance deficits, and decreased activity tolerance. Pt currently requires MOD A for LBD while in seated position due to pain and limited AROM of R knee. MIN A x2 + RW for ADL t/f - asssit to maintain TDWBing pcns. MOD I grooming seated in chair. Pt instructed in falls prevention strategies, home/routines modifications, DME/AE for LB bathing and dressing tasks, and compression stocking mgt. Pt would benefit from skilled OT services including additional instruction in dressing techniques with or without assistive devices for dressing and bathing skills to support recall and carryover prior to discharge and ultimately to maximize safety, independence, and minimize falls risk and caregiver burden. Recommend SNF at discharge to maximize pt safety and return to PLOF.    Follow Up Recommendations  SNF    Equipment Recommendations   (TBD)    Recommendations for Other Services       Precautions / Restrictions Precautions Precautions: Fall;Knee Restrictions Weight Bearing Restrictions: Yes RLE Weight Bearing: Touchdown weight bearing Other  Position/Activity Restrictions: foot flat weight bearing      Mobility Bed Mobility               General bed mobility comments: pt received and left up in chair   Transfers Overall transfer level: Needs assistance Equipment used: Rolling walker (2 wheeled) Transfers: Sit to/from Stand Sit to Stand: Min assist         General transfer comment: MIN A + RW + VCs to maintain TDWBing pcns    Balance Overall balance assessment: Needs assistance Sitting-balance support: No upper extremity supported;Feet supported Sitting balance-Leahy Scale: Good     Standing balance support: Bilateral upper extremity supported;During functional activity Standing balance-Leahy Scale: Fair Standing balance comment: Pt demonstrated good ability to bear weight through BUE and LLE; compliant w/ WB status throughout. Pt observed to fatigue after a few steps likely due to increased weight placed through BUE                           ADL either performed or assessed with clinical judgement   ADL Overall ADL's : Needs assistance/impaired                                       General ADL Comments: MOD A doff B socks seated EOB - pt reports use of sock aid at baseline. MIN A x2 + RW for ADL t/f - asssit to maintain TDWBing pcns. MOD I grooming seated in chair.      Vision  Perception     Praxis      Pertinent Vitals/Pain Pain Assessment: No/denies pain     Hand Dominance Right   Extremity/Trunk Assessment Upper Extremity Assessment Upper Extremity Assessment: Overall WFL for tasks assessed   Lower Extremity Assessment Lower Extremity Assessment: Defer to PT evaluation       Communication Communication Communication: No difficulties   Cognition Arousal/Alertness: Awake/alert Behavior During Therapy: WFL for tasks assessed/performed Overall Cognitive Status: Within Functional Limits for tasks assessed                                  General Comments: A&O x4   General Comments       Exercises Exercises: Other exercises Other Exercises: Pt educated re: OT role, DME recs, d/c recs, falls prevention, polar care mgmt, RW technique, functional application of WBing pcns Other Exercises: LBD, grooming, sit<>stand, sitting/standing balance/tolerance   Shoulder Instructions      Home Living Family/patient expects to be discharged to:: Private residence Living Arrangements: Spouse/significant other Available Help at Discharge: Family;Available 24 hours/day Type of Home: House Home Access: Level entry (pt reports single stair to enter kitchen)     Home Layout: One level     Bathroom Shower/Tub: Producer, television/film/video: Handicapped height Bathroom Accessibility: Yes   Home Equipment: Environmental consultant - 2 wheels;Cane - single point;Bedside commode;Shower seat;Toilet riser;Grab bars - tub/shower;Adaptive equipment Adaptive Equipment: Sock aid Additional Comments: pt states small step to enter shower      Prior Functioning/Environment Level of Independence: Independent with assistive device(s)        Comments: Pt reports household ambulation w/ no AD; community ambulation w/ SPC; independent w/ ADLs/IADLs; pt states 2x falls in past year via being careless (missed a step and tripped)        OT Problem List: Decreased strength;Decreased range of motion;Decreased activity tolerance;Impaired balance (sitting and/or standing);Decreased knowledge of use of DME or AE      OT Treatment/Interventions: Self-care/ADL training;Therapeutic exercise;Energy conservation;DME and/or AE instruction;Therapeutic activities;Patient/family education;Balance training    OT Goals(Current goals can be found in the care plan section) Acute Rehab OT Goals Patient Stated Goal: to get better and go home OT Goal Formulation: With patient Time For Goal Achievement: 02/23/20 Potential to Achieve Goals: Good ADL Goals Pt Will Perform  Grooming: with modified independence;sitting Pt Will Perform Lower Body Dressing: with supervision;sit to/from stand;with caregiver independent in assisting;with adaptive equipment (c LRAD PRN) Pt Will Transfer to Toilet: with supervision;ambulating;bedside commode (c LRAD PRN & MIN VCs for WBing pcns) Pt Will Perform Toileting - Clothing Manipulation and hygiene: with modified independence;sitting/lateral leans  OT Frequency: Min 1X/week   Barriers to D/C: Inaccessible home environment          Co-evaluation              AM-PAC OT "6 Clicks" Daily Activity     Outcome Measure Help from another person eating meals?: None Help from another person taking care of personal grooming?: None Help from another person toileting, which includes using toliet, bedpan, or urinal?: A Little Help from another person bathing (including washing, rinsing, drying)?: A Lot Help from another person to put on and taking off regular upper body clothing?: A Little Help from another person to put on and taking off regular lower body clothing?: A Lot 6 Click Score: 18   End of Session Equipment Utilized During Treatment: Rolling walker  Activity Tolerance: Patient tolerated treatment well Patient left: in chair;with call bell/phone within reach;with chair alarm set  OT Visit Diagnosis: Other abnormalities of gait and mobility (R26.89)                Time: 1322-1340 OT Time Calculation (min): 18 min Charges:  OT General Charges $OT Visit: 1 Visit OT Evaluation $OT Eval Moderate Complexity: 1 Mod OT Treatments $Self Care/Home Management : 8-22 mins  Kathie Dike, M.S. OTR/L  02/09/20, 2:39 PM  ascom [phone]

## 2020-02-09 NOTE — NC FL2 (Signed)
[place] MEDICAID FL2 LEVEL OF CARE SCREENING TOOL     IDENTIFICATION  Patient Name: Linda Ibarra Birthdate: June 14, 1941 Sex: female Admission Date (Current Location): 02/08/2020  [place] and [place] Number:  Chiropodist and Address:  [facility], [address] [place], [place] [address]      Provider Number: [id number]  Attending Physician Name and Address:  Donato Heinz, MD  Relative Name and Phone Number:  Chrissie Noa SPouse [phone]    Current Level of Care: Hospital Recommended Level of Care: Skilled Nursing Facility Prior Approval Number:    Date Approved/Denied:   PASRR Number: [id number]  Discharge Plan: SNF    Current Diagnoses: Patient Active Problem List   Diagnosis Date Noted  . 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 Chronic kidney disease, stage III (moderate) 07/24/2018  . 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 Hyperlipidemia 07/15/2015  . Obesity 01/06/2014  . Bilateral dry eyes 12/30/2013  . Hypertension 12/30/2013  . 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 Primary open angle glaucoma (POAG) of both eyes 12/30/2013    Orientation RESPIRATION BLADDER Height & Weight     Self, Time, Situation, Place  Normal Continent Weight:   Height:     BEHAVIORAL SYMPTOMS/MOOD NEUROLOGICAL BOWEL NUTRITION STATUS      Continent Diet (regular)  AMBULATORY STATUS COMMUNICATION OF NEEDS Skin   Extensive Assist Verbally Surgical wounds                       Personal Care Assistance Level of Assistance  Bathing, Feeding, Dressing Bathing Assistance: Limited assistance Feeding assistance: Limited assistance Dressing Assistance: Limited assistance     Functional Limitations Info  Sight, Speech Sight Info: Adequate   Speech Info: Adequate    SPECIAL CARE FACTORS FREQUENCY  PT (By licensed PT)     PT Frequency: 5 times per week              Contractures Contractures  Info: Not present    Additional Factors Info  Code Status, Allergies Code Status Info: full code Allergies Info: sudafed, tramadol, metoprolol           Current Medications (02/09/2020):  This is the current hospital active medication list Current Facility-Administered Medications  Medication Dose Route Frequency Provider Last Rate Last Admin  . 0.9 %  sodium chloride infusion   Intravenous Continuous Hooten, Illene Labrador, MD   Stopped at 02/09/20 1356  . acetaminophen (TYLENOL) tablet 325-650 mg  325-650 mg Oral Q6H PRN Hooten, Illene Labrador, MD      . alum & mag hydroxide-simeth (MAALOX/MYLANTA) 200-200-20 MG/5ML suspension 30 mL  30 mL Oral Q4H PRN Hooten, Illene Labrador, MD      . bisacodyl (DULCOLAX) suppository 10 mg  10 mg Rectal Daily PRN Hooten, Illene Labrador, MD      . calcium citrate-vitamin D 500-500 MG-UNIT per chewable tablet 1 tablet  1 tablet Oral Daily Hooten, Illene Labrador, MD   1 tablet at 02/09/20 [id number]  . ceFAZolin (ANCEF) IVPB 2g/100 mL premix  2 g Intravenous Q6H Hooten, Illene Labrador, MD 200 mL/hr at 02/09/20 0646 2 g at 02/09/20 0646  . celecoxib (CELEBREX) capsule 200 mg  200 mg Oral BID Donato Heinz, MD   200 mg at 02/09/20 0827  . diphenhydrAMINE (BENADRYL) 12.5 MG/5ML elixir 12.5-25 mg  12.5-25 mg Oral Q4H PRN Hooten, Illene Labrador, MD      . dorzolamide-timolol (  COSOPT) 22.3-6.8 MG/ML ophthalmic solution 1 drop  1 drop Both Eyes BID Hooten, Illene Labrador, MD   1 drop at 02/09/20 0830  . enoxaparin (LOVENOX) injection 30 mg  30 mg Subcutaneous Q12H Hooten, Illene Labrador, MD   30 mg at 02/09/20 1357  . ferrous sulfate tablet 325 mg  325 mg Oral BID WC Hooten, Illene Labrador, MD   325 mg at 02/09/20 0827  . fluticasone (FLONASE) 50 MCG/ACT nasal spray 1 spray  1 spray Each Nare Daily Hooten, Illene Labrador, MD   1 spray at 02/09/20 0829  . gabapentin (NEURONTIN) capsule 300 mg  300 mg Oral QHS Hooten, Illene Labrador, MD   300 mg at 02/08/20 2130  . hydrochlorothiazide (HYDRODIURIL) tablet 12.5 mg  12.5 mg Oral Daily Hooten, Illene Labrador, MD       . HYDROmorphone (DILAUDID) injection 0.5-1 mg  0.5-1 mg Intravenous Q4H PRN Hooten, Illene Labrador, MD   1 mg at 02/08/20 1824  . latanoprost (XALATAN) 0.005 % ophthalmic solution 1 drop  1 drop Both Eyes QHS Hooten, Illene Labrador, MD   1 drop at 02/08/20 2130  . losartan (COZAAR) tablet 25 mg  25 mg Oral Daily Hooten, Illene Labrador, MD   25 mg at 02/08/20 1703  . magnesium hydroxide (MILK OF MAGNESIA) suspension 30 mL  30 mL Oral Daily Hooten, Illene Labrador, MD   30 mL at 02/09/20 0828  . menthol-cetylpyridinium (CEPACOL) lozenge 3 mg  1 lozenge Oral PRN Hooten, Illene Labrador, MD       Or  . phenol (CHLORASEPTIC) mouth spray 1 spray  1 spray Mouth/Throat PRN Hooten, Illene Labrador, MD      . metoCLOPramide (REGLAN) tablet 10 mg  10 mg Oral TID AC & HS Hooten, Illene Labrador, MD   10 mg at 02/09/20 1353  . multivitamin with minerals tablet 1 tablet  1 tablet Oral Daily Hooten, Illene Labrador, MD   1 tablet at 02/09/20 0827  . ondansetron (ZOFRAN) tablet 4 mg  4 mg Oral Q6H PRN Hooten, Illene Labrador, MD       Or  . ondansetron (ZOFRAN) injection 4 mg  4 mg Intravenous Q6H PRN Hooten, Illene Labrador, MD   4 mg at 02/08/20 1824  . oxybutynin (DITROPAN) tablet 5 mg  5 mg Oral Daily Hooten, Illene Labrador, MD   5 mg at 02/09/20 0829  . oxyCODONE (Oxy IR/ROXICODONE) immediate release tablet 10 mg  10 mg Oral Q4H PRN Hooten, Illene Labrador, MD   10 mg at 02/09/20 1259  . oxyCODONE (Oxy IR/ROXICODONE) immediate release tablet 5 mg  5 mg Oral Q4H PRN Hooten, Illene Labrador, MD      . pantoprazole (PROTONIX) EC tablet 40 mg  40 mg Oral BID Donato Heinz, MD   40 mg at 02/09/20 0827  . senna-docusate (Senokot-S) tablet 1 tablet  1 tablet Oral BID Hooten, Illene Labrador, MD   1 tablet at 02/09/20 0827  . sodium phosphate (FLEET) 7-19 GM/118ML enema 1 enema  1 enema Rectal Once PRN Hooten, Illene Labrador, MD      . traMADol Janean Sark) tablet 50-100 mg  50-100 mg Oral Q4H PRN Hooten, Illene Labrador, MD         Discharge Medications: Please see discharge summary for a list of discharge  medications.  Relevant Imaging Results:  Relevant Lab Results:   Additional Information SS# [id number]  Barrie Dunker, RN

## 2020-02-09 NOTE — Progress Notes (Signed)
   02/09/20 1300  Clinical Encounter Type  Visited With Patient  Visit Type Follow-up  Referral From Chaplain  Consult/Referral To Chaplain  While rounding chaplain visited with patient. She said she is feel better. Patient told chaplain that she has been married for 58 years and she has one son. Patient was cold and chaplain brought her warm blankets. Chaplain left because physical therapy staff came to work with patient.

## 2020-02-09 NOTE — Progress Notes (Signed)
Physical Therapy Treatment Patient Details Name: Linda Ibarra MRN: [id number] DOB: 1940/08/02 Today's Date: 02/09/2020    History of Present Illness Pt is a 79 year old female w/ PMH of HTN, renal insufficiency, renal disease, arthritis, anemia, lumbar fusion, and L TKA in 2017. Per MD impression, pt presents with primary degenerative arthrosis of R knee as well as intraoperative findings of R medial tibial plateau fracture. Pt is now s/p R TKA and R ORIF of the medial tibial plateau.    PT Comments    Pt pleasant and motivated to participate during the session. Pt reported no pain at beginning of session. R knee AROM this a.m. measured as 0-78deg; pt stated pain up to about 4/10 w/ end-range knee flexion. Pt able to perform supine and seated exercises w/ min instructional cueing. Pt SpO2 98% and HR 52bpm in supine. Pt mod-I w/ bed mobility; required slight extra time and effort supine-sit. Pt able to sit-to-stand from elevated surface with min-A to initiate movement; pt demonstrated good ability to put weight through BUE and LLE while adhering to WB restrictions. Pt demonstrated good carryover from yesterday's gait training; able to ambulate this a.m. while complying w/ foot flat TDWB restriction and x2 close CGA w/ min cueing for sequencing. Pt required significant effort to bear weight through BUE and chair brought to pt after ~1ft such that WB restrictions would not be broken 2/2 fatigue. Pt reported no increased pain w/ ambulation and that she felt good throughout. Pt left in recliner w/ BLE extended; SpO2 98% and HR 50bpm at this point. Pt will benefit from PT services in a SNF setting upon discharge to safely address deficits listed in patient problem list for decreased caregiver assistance and eventual return to PLOF.   Follow Up Recommendations  SNF     Equipment Recommendations  None recommended by PT    Recommendations for Other Services       Precautions / Restrictions  Precautions Precautions: Fall Precaution Comments: Pt was able to perform R SLR x10: no KI required Restrictions Weight Bearing Restrictions: Yes RLE Weight Bearing: Touchdown weight bearing Other Position/Activity Restrictions: foot flat weight bearing    Mobility  Bed Mobility Overal bed mobility: Modified Independent             General bed mobility comments: increased time and effort; use of handrails, no physical assist required  Transfers Overall transfer level: Needs assistance Equipment used: Rolling walker (2 wheeled) Transfers: Sit to/from Stand Sit to Stand: From elevated surface;Min assist         General transfer comment: Min assist provided to help initiate movement; pt able to complete movement w/ LLE and BUE support while complying w/ TDWB precautions  Ambulation/Gait Ambulation/Gait assistance: Min guard Gait Distance (Feet): 4 Feet Assistive device: Rolling walker (2 wheeled) Gait Pattern/deviations: Step-to pattern;Decreased step length - left;Decreased stance time - right Gait velocity: decreased   General Gait Details: Pt able to take a few small steps forward w/ a step-to pattern and foot flat TDWB on RLE; pt compliant w/ WB status throughout. Pt demonstrated good ability to bear weight through BUE and LLE with significant effort; pt appeared to fatigue after a few steps and chair brought to patient at that point   Stairs             Wheelchair Mobility    Modified Rankin (Stroke Patients Only)       Balance Overall balance assessment: Needs assistance Sitting-balance support: Single extremity supported;Bilateral  upper extremity supported;Feet unsupported Sitting balance-Leahy Scale: Good     Standing balance support: Bilateral upper extremity supported;During functional activity Standing balance-Leahy Scale: Fair Standing balance comment: Pt demonstrated good ability to bear weight through BUE and LLE; compliant w/ WB status  throughout. Pt observed to fatigue after a few steps likely due to increased weight placed through BUE                            Cognition Arousal/Alertness: Awake/alert Behavior During Therapy: WFL for tasks assessed/performed Overall Cognitive Status: Within Functional Limits for tasks assessed                                        Exercises Total Joint Exercises Ankle Circles/Pumps: AROM;Both;10 reps Quad Sets: Strengthening;Both;10 reps Gluteal Sets: Strengthening;Both;10 reps Hip ABduction/ADduction: AROM;Strengthening;Right;10 reps Straight Leg Raises: AROM;Strengthening;Right;10 reps Long Arc Quad: AROM;Strengthening;Right;10 reps Knee Flexion: AROM;Strengthening;Right;10 reps Goniometric ROM: R knee AROM: 0-78deg Marching in Standing: AROM;Left;Standing;5 reps (compliant w/ foot flat TDWB throughout) Other Exercises Other Exercises: Education on gait sequencing and foot flat WB status    General Comments        Pertinent Vitals/Pain Pain Assessment: No/denies pain    Home Living                      Prior Function            PT Goals (current goals can now be found in the care plan section) Progress towards PT goals: Progressing toward goals    Frequency    BID      PT Plan      Co-evaluation              AM-PAC PT "6 Clicks" Mobility   Outcome Measure  Help needed turning from your back to your side while in a flat bed without using bedrails?: None Help needed moving from lying on your back to sitting on the side of a flat bed without using bedrails?: None Help needed moving to and from a bed to a chair (including a wheelchair)?: A Lot Help needed standing up from a chair using your arms (e.g., wheelchair or bedside chair)?: A Little Help needed to walk in hospital room?: A Lot Help needed climbing 3-5 steps with a railing? : Total 6 Click Score: 16    End of Session Equipment Utilized During  Treatment: Gait belt Activity Tolerance: Patient tolerated treatment well Patient left: in chair;with call bell/phone within reach;with chair alarm set;with SCD's reapplied;Other (comment) (polarcare reapplied) Nurse Communication: Mobility status;Weight bearing status PT Visit Diagnosis: Unsteadiness on feet (R26.81);Other abnormalities of gait and mobility (R26.89);Muscle weakness (generalized) (M62.81);History of falling (Z91.81);Pain Pain - Right/Left: Right Pain - part of body: Knee     Time: 8841-6606 PT Time Calculation (min) (ACUTE ONLY): 33 min  Charges:                        Azar South SPT 02/09/20, 11:49 AM

## 2020-02-09 NOTE — TOC Progression Note (Signed)
Transition of Care [facility]) - Progression Note    Patient Details  Name: Linda Ibarra MRN: [id number] Date of Birth: March 23, 1941  Transition of Care [facility]) CM/SW [place], RN Phone Number: 02/09/2020, 2:07 PM  Clinical Narrative:    Met with the patient to discuss DC plan and needs She is agreeable to go to SNF for rehab She does not want to go to [facility] or to [facility] She has had her Covid Vaccines She is agreeable to having a bed search being completed and will discuss Bed Options once obtained        Expected Discharge Plan and Services                                                 Social Determinants of Health (SDOH) Interventions    Readmission Risk Interventions No flowsheet data found.

## 2020-02-09 NOTE — Progress Notes (Signed)
  Subjective: 1 Day Post-Op Procedure(s) (LRB): COMPUTER ASSISTED TOTAL KNEE ARTHROPLASTY  (Right) OPEN REDUCTION INTERNAL FIXATION (ORIF) TIBIAL PLATEAU (Right) Patient reports pain as well-controlled.   Patient is well, but has had some minor complaints of nausea and vomiting yesterday. No complaints this AM. Plan is to go Home after hospital stay. Negative for chest pain and shortness of breath Fever: no Gastrointestinal: negative for nausea and vomiting this AM.  Patient has not had a bowel movement.  Objective: Vital signs in last 24 hours: Temp:  [97.5 F (36.4 C)-98.1 F (36.7 C)] 97.9 F (36.6 C) (08/03 0739) Pulse Rate:  [43-100] 54 (08/03 0739) Resp:  [10-20] 16 (08/03 0739) BP: (99-158)/(52-93) 107/55 (08/03 0739) SpO2:  [95 %-100 %] 99 % (08/03 0739)  Intake/Output from previous day:  Intake/Output Summary (Last 24 hours) at 02/09/2020 0814 Last data filed at 02/09/2020 0740 Gross per 24 hour  Intake 2958.59 ml  Output 910 ml  Net 2048.59 ml    Intake/Output this shift: Total I/O In: -  Out: 50 [Drains:50]  Labs: No results for input(s): HGB in the last 72 hours. No results for input(s): WBC, RBC, HCT, PLT in the last 72 hours. No results for input(s): NA, K, CL, CO2, BUN, CREATININE, GLUCOSE, CALCIUM in the last 72 hours. No results for input(s): LABPT, INR in the last 72 hours.   EXAM General - Patient is Alert, Appropriate and Oriented Extremity - Neurovascular intact Dorsiflexion/Plantar flexion intact Compartment soft Dressing/Incision -Postoperative dressing remains in place., Polar Care in place and working. , Hemovac in place.  Motor Function - intact, moving foot and toes well on exam.  Cardiovascular- slightly decreased rate, regular rhythm with no m/r/g Respiratory- Lungs clear to auscultation bilaterally Gastrointestinal- soft, nontender and hypoactive bowel sounds   Assessment/Plan: 1 Day Post-Op Procedure(s) (LRB): COMPUTER ASSISTED TOTAL  KNEE ARTHROPLASTY  (Right) OPEN REDUCTION INTERNAL FIXATION (ORIF) TIBIAL PLATEAU (Right) Active Problems:   Total knee replacement status  Estimated body mass index is 36.56 kg/m as calculated from the following:   Height as of 02/01/20: 4\' 11"  (1.499 m).   Weight as of 02/01/20: 82.1 kg. Advance diet Up with therapy Plan for discharge tomorrow   DVT Prophylaxis - Lovenox, Ted hose and foot pumps Foot flat weight bearing to right leg.  02/03/20, PA-C [facility] Orthopaedic Surgery 02/09/2020, 8:14 AM

## 2020-02-09 NOTE — TOC Progression Note (Signed)
Transition of Care [facility]) - Progression Note    Patient Details  Name: Linda Ibarra MRN: [id number] Date of Birth: [birth year]  Transition of Care [facility]) CM/SW Contact  Barrie Dunker, RN Phone Number: 02/09/2020, 3:27 PM  Clinical Narrative:    Spoke with the patient in the room to discuss the bed options, she would like to go to [facility].  I called [facility] at [facility] and accepted the bed, I called [employer] and started the the insurance approval process, faxed clinical to [facility], ref number [id number]        Expected Discharge Plan and Services                                                 Social Determinants of Health (SDOH) Interventions    Readmission Risk Interventions No flowsheet data found.

## 2020-02-10 MED ORDER — TRAMADOL HCL 50 MG PO TABS: 50.0000 mg | ORAL_TABLET | ORAL | 0 refills | Status: AC | PRN

## 2020-02-10 MED ORDER — CELECOXIB 200 MG PO CAPS: 200.0000 mg | ORAL_CAPSULE | Freq: Two times a day (BID) | ORAL | 0 refills | Status: DC

## 2020-02-10 MED ORDER — ENOXAPARIN SODIUM 40 MG/0.4ML ~~LOC~~ SOLN
40.0000 mg | SUBCUTANEOUS | 0 refills | Status: DC
Start: 2020-02-10 — End: 2020-08-02

## 2020-02-10 MED ORDER — OXYCODONE HCL 5 MG PO TABS: 5.0000 mg | ORAL_TABLET | ORAL | 0 refills | Status: AC | PRN

## 2020-02-10 NOTE — Discharge Summary (Addendum)
Physician Discharge Summary  Patient ID: Linda Ibarra MRN: [id number] DOB/AGE: 79-Jun-1942 79 y.o.  Admit date: 02/08/2020 Discharge date: 02/12/2020  Admission Diagnoses:  Total knee replacement status [Z96.659]  Surgeries:Procedure(s): Right total knee arthroplasty using computer-assisted navigation Open reduction and internal fixation of the right medial tibial plateau fracture SURGEON:  Donato Heinz, Jr. M.D.   ASSISTANT: Baldwin [place], PA-C (present and scrubbed throughout the case, critical for assistance with exposure, retraction, instrumentation, and closure)   ANESTHESIA: spinal   ESTIMATED BLOOD LOSS: 50 mL   FLUIDS REPLACED: 1400 mL of crystalloid   TOURNIQUET TIME: #1-120 minutes           number 2-20 minutes   DRAINS: 2 medium Hemovac drains   SOFT TISSUE RELEASES: Anterior cruciate ligament, posterior cruciate ligament, deep medial collateral ligament, patellofemoral ligament   IMPLANTS UTILIZED: DePuy PFC Sigma size 2.5 posterior stabilized femoral component (cemented), size 2.5 MBT tibial component (cemented), 35 mm 3 peg oval dome patella (cemented), and a 15 mm stabilized rotating platform polyethylene insert.  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 female who  was admitted 02/08/2020 with a diagnosis of right knee osteoarthritis and went to the operating room on 02/08/2020 and underwent right total knee arthroplasty with open reduction, internal fixation of intra-operative right medial tibial plateau fracture. The patient received perioperative antibiotics for prophylaxis (see below). The patient tolerated the procedure well and was transported to PACU in stable condition. After meeting PACU criteria, the patient was subsequently transferred to the Orthopaedics/Rehabilitation unit.   The patient received DVT prophylaxis in the form of early mobilization, Lovenox, Foot Pumps and TED hose. A sacral pad had been placed and heels were elevated off of the bed with rolled towels in order to protect skin integrity. Foley catheter was discontinued on postoperative day #0. Wound drains were discontinued on postoperative day #2. The surgical incision was healing well without signs of infection.  Physical therapy was initiated postoperatively for transfers, gait training, and strengthening. Occupational therapy was initiated for activities of daily living and evaluation for assisted devices. Rehabilitation goals were reviewed in detail with the patient. The patient made steady progress with physical therapy and physical therapy recommended discharge to Skilled nursing facility.   The patient achieved the preliminary goals of this hospitalization and was felt to be medically and orthopaedically appropriate for discharge.  She was given perioperative antibiotics:  Anti-infectives (From admission, onward)    Start     Dose/Rate Route Frequency Ordered Stop   02/08/20 1800  ceFAZolin (ANCEF) IVPB 2g/100 mL premix        2 g 200 mL/hr over 30 Minutes Intravenous Every 6 hours 02/08/20 1414 02/09/20 1607   02/08/20 0630  ceFAZolin (ANCEF) 2-4 GM/100ML-% IVPB       Note to Pharmacy: Desma Paganini   : cabinet override      02/08/20 0630 02/08/20 0743   02/08/20 0600  ceFAZolin  (ANCEF) IVPB 2g/100 mL premix        2 g 200 mL/hr over 30 Minutes Intravenous On call to O.R. 02/08/20 0002 02/08/20 1141     .  Recent vital signs:  Vitals:   02/10/20 0001 02/10/20 0801  BP: 110/61 124/86  Pulse: (!) 48 74  Resp: 16 18  Temp: 98 F (36.7 C) 97.9 F (36.6 C)  SpO2: 100% 98%    Recent laboratory studies:  No results for input(s): WBC, HGB, HCT, PLT, K, CL, CO2, BUN, CREATININE, GLUCOSE, CALCIUM, LABPT, INR in the last 72 hours.  Diagnostic Studies: DG Knee 1-2 Views Right  Result Date: 02/08/2020 CLINICAL DATA:  Surgery EXAM: RIGHT KNEE - 1-2 VIEW; DG C-ARM 1-60 MIN FLUOROSCOPY TIME:  6 seconds COMPARISON:  None. FINDINGS: Frontal and lateral radiographs demonstrate placement of a plate along the lateral aspect of the proximal tibia with 4 screws. Partially imaged total knee arthroplasty. The side is not marked but appears to be the right extremity. IMPRESSION: Fluoroscopic guidance for total knee arthroplasty. Electronically Signed   By: Guadlupe Spanish M.D.   On: 02/08/2020 12:21   DG Knee Right Port  Result Date: 02/08/2020 CLINICAL DATA:  Status post total knee replacement EXAM: PORTABLE RIGHT KNEE - 1-2 VIEW COMPARISON:  None. FINDINGS: Frontal and lateral views were obtained. There is evidence of screw and plate fixation in the proximal tibia. There is a total knee replacement with femoral and tibial prosthetic components well-seated. No acute fracture or dislocation. There are skin staples. There is a drain anteriorly. IMPRESSION: Status post total knee replacement with prosthetic components well-seated. Postoperative change proximal tibia with alignment anatomic in this area. No fracture or dislocation evident. Acute postoperative changes noted. Electronically Signed   By: Bretta Bang III M.D.   On: 02/08/2020 13:16   DG C-Arm 1-60 Min  Result Date: 02/08/2020 CLINICAL DATA:  Surgery EXAM: RIGHT KNEE - 1-2 VIEW; DG C-ARM 1-60 MIN FLUOROSCOPY TIME:  6  seconds COMPARISON:  None. FINDINGS: Frontal and lateral radiographs demonstrate placement of a plate along the lateral aspect of the proximal tibia with 4 screws. Partially imaged total knee arthroplasty. The side is not marked but appears to be the right extremity. IMPRESSION: Fluoroscopic guidance for total knee arthroplasty. Electronically Signed   By: Guadlupe Spanish M.D.   On: 02/08/2020 12:21    Discharge Medications:   Allergies as of 02/10/2020       Reactions   Sudafed [pseudoephedrine] Hives   Metoprolol Other (See Comments)   Headache and Nausea    Tramadol Nausea Only, Nausea And Vomiting        Medication List     TAKE these medications    acetaminophen 650 MG CR tablet Commonly known as: TYLENOL Take 1,300 mg by mouth in the morning and at bedtime.   Calcium Citrate-Vitamin D 315-250 MG-UNIT Tabs Take 1 tablet by mouth daily.   celecoxib 200 MG capsule Commonly known as: CELEBREX Take 1 capsule (200 mg total) by mouth 2 (two) times daily.   Centrum Adults Tabs Take 1 tablet by mouth daily.   dorzolamide-timolol 22.3-6.8 MG/ML ophthalmic solution Commonly known as: COSOPT Place 1 drop into both eyes 2 (two) times daily.   enoxaparin 40 MG/0.4ML injection Commonly known as:  LOVENOX Inject 0.4 mLs (40 mg total) into the skin daily for 14 days.   fluticasone 50 MCG/ACT nasal spray Commonly known as: FLONASE Place 1 spray into both nostrils daily.   hydrochlorothiazide 25 MG tablet Commonly known as: HYDRODIURIL Take 12.5 mg by mouth daily.   latanoprost 0.005 % ophthalmic solution Commonly known as: XALATAN Place 1 drop into both eyes at bedtime.   losartan 25 MG tablet Commonly known as: COZAAR Take 25 mg by mouth daily.   oxybutynin 5 MG tablet Commonly known as: DITROPAN Take 5 mg by mouth daily.   oxyCODONE 5 MG immediate release tablet Commonly known as: Oxy IR/ROXICODONE Take 1 tablet (5 mg total) by mouth every 4 (four) hours as needed  for moderate pain (pain score 4-6).   Red Yeast Rice 600 MG Tabs Take 600 mg by mouth daily.   traMADol 50 MG tablet Commonly known as: ULTRAM Take 1 tablet (50 mg total) by mouth every 4 (four) hours as needed for moderate pain.               Durable Medical Equipment  (From admission, onward)           Start     Ordered   02/08/20 1415  DME Walker rolling  Once       Question:  Patient needs a walker to treat with the following condition  Answer:  Total knee replacement status   02/08/20 1414   02/08/20 1415  DME Bedside commode  Once       Question:  Patient needs a bedside commode to treat with the following condition  Answer:  Total knee replacement status   02/08/20 1414            Disposition:  skilled nursing facility ([facility])      Contact information for follow-up providers     Myrtis Ser On 02/23/2020.   Specialty: Orthopedic Surgery Why: at 1:30pm Contact information: [address] [facility] [place] [address] [phone]         Donato Heinz, MD On 03/22/2020.   Specialty: Orthopedic Surgery Why: at 3:00pm Contact information: [address] [place] [address] [phone]              Contact information for after-discharge care     Destination     [facility] Preferred SNF .   Service: Skilled Nursing Contact information: [address] [place] [address] [phone]                      Linda Beech, PA-C 02/10/2020, 8:18 AM

## 2020-02-10 NOTE — Plan of Care (Signed)

## 2020-02-10 NOTE — Progress Notes (Signed)
  Subjective: 2 Days Post-Op Procedure(s) (LRB): COMPUTER ASSISTED TOTAL KNEE ARTHROPLASTY  (Right) OPEN REDUCTION INTERNAL FIXATION (ORIF) TIBIAL PLATEAU (Right) Patient reports pain as well-controlled.   Patient is well, and has had no acute complaints or problems Plan is to go Skilled nursing facility after hospital stay. Negative for chest pain and shortness of breath Fever: no Gastrointestinal: negative for nausea and vomiting.   Patient has not had a bowel movement.  Objective: Vital signs in last 24 hours: Temp:  [97.9 F (36.6 C)-98 F (36.7 C)] 97.9 F (36.6 C) (08/04 0801) Pulse Rate:  [48-74] 74 (08/04 0801) Resp:  [16-18] 18 (08/04 0801) BP: (110-134)/(56-86) 124/86 (08/04 0801) SpO2:  [97 %-100 %] 98 % (08/04 0801)  Intake/Output from previous day:  Intake/Output Summary (Last 24 hours) at 02/10/2020 0814 Last data filed at 02/10/2020 0432 Gross per 24 hour  Intake 480 ml  Output 130 ml  Net 350 ml    Intake/Output this shift: No intake/output data recorded.  Labs: No results for input(s): HGB in the last 72 hours. No results for input(s): WBC, RBC, HCT, PLT in the last 72 hours. No results for input(s): NA, K, CL, CO2, BUN, CREATININE, GLUCOSE, CALCIUM in the last 72 hours. No results for input(s): LABPT, INR in the last 72 hours.   EXAM General - Patient is Alert, Appropriate and Oriented Extremity - Neurovascular intact Dorsiflexion/Plantar flexion intact Compartment soft Dressing/Incision -Postoperative dressing remains in place., Polar Care in place and working. , Hemovac in place.  Following postop dressing removal, minimal sanguinous drainage noted with honeycomb. Motor Function - intact, moving foot and toes well on exam.  Cardiovascular- Regular rate and rhythm, no murmurs/rubs/gallops Respiratory- Lungs clear to auscultation bilaterally Gastrointestinal- soft, nontender and active bowel sounds   Assessment/Plan: 2 Days Post-Op Procedure(s)  (LRB): COMPUTER ASSISTED TOTAL KNEE ARTHROPLASTY  (Right) OPEN REDUCTION INTERNAL FIXATION (ORIF) TIBIAL PLATEAU (Right) Active Problems:   Total knee replacement status  Estimated body mass index is 36.56 kg/m as calculated from the following:   Height as of 02/01/20: 4\' 11"  (1.499 m).   Weight as of 02/01/20: 82.1 kg. Advance diet Up with therapy Discharge to SNF   Postoperative dressing removed.  Honeycomb dressing exchange.  Hemovac removed.  Many compression dressing applied.  DVT Prophylaxis - Lovenox, Ted hose and foot pumps Foot flat weightbearing to right leg  02/03/20, PA-C [facility] Orthopaedic Surgery 02/10/2020, 8:14 AM

## 2020-02-10 NOTE — Plan of Care (Signed)
  Problem: Education: Goal: Knowledge of General Education information will improve Description Including pain rating scale, medication(s)/side effects and non-pharmacologic comfort measures Outcome: Progressing   

## 2020-02-10 NOTE — Progress Notes (Signed)
Physical Therapy Treatment Patient Details Name: Linda Ibarra MRN: [id number] DOB: 10/19/1940 Today's Date: 02/10/2020    History of Present Illness Pt is a 79 year old female w/ PMH of HTN, renal insufficiency, renal disease, arthritis, anemia, lumbar fusion, and L TKA in 2017. Per MD impression, pt presents with primary degenerative arthrosis of R knee as well as intraoperative findings of R medial tibial plateau fracture. Pt is now s/p R TKA and R ORIF of the medial tibial plateau.    PT Comments    Pt pleasant and motivated to participate during the session. Pt independent with supine/sitting exercises and reported compliance w/ HEP. Pt R knee AROM measured as 0-79deg w/ mild pain at end-range flexion. Pt mod-I w/ bed mobility. CGA provided for sit-to-stand and ambulation w/ min cueing for sequencing for adherence to WB restrictions. Once standing, pt reported needing to use the bathroom. Pt able to walk ~4ft to [facility] w/ compliance to WB restrictions and heavy BUE support on RW. Pt left on BSC and told to ring nurse when finished. Pt will benefit from PT services in a SNF setting upon discharge to safely address deficits listed in patient problem list for decreased caregiver assistance and eventual return to PLOF.   Follow Up Recommendations  SNF     Equipment Recommendations  None recommended by PT    Recommendations for Other Services       Precautions / Restrictions Precautions Precautions: Fall Precaution Comments: Pt was able to perform R SLR x10: no KI required Restrictions Weight Bearing Restrictions: Yes RLE Weight Bearing: Touchdown weight bearing Other Position/Activity Restrictions: foot flat weight bearing    Mobility  Bed Mobility Overal bed mobility: Modified Independent                Transfers Overall transfer level: Needs assistance Equipment used: Rolling walker (2 wheeled) Transfers: Sit to/from Stand Sit to Stand: Min guard         General  transfer comment: Min VCs to maintain TDWB precautions  Ambulation/Gait Ambulation/Gait assistance: Min guard Gait Distance (Feet): 3 Feet Assistive device: Rolling walker (2 wheeled) Gait Pattern/deviations: Step-to pattern;Decreased step length - left;Decreased stance time - right Gait velocity: decreased   General Gait Details: Pt found in bed. Able to walk ~36ft to [facility]; min cueing to adhere to WB restrictions; pt demonstrated good use of BUE on RW   Stairs             Wheelchair Mobility    Modified Rankin (Stroke Patients Only)       Balance Overall balance assessment: Needs assistance Sitting-balance support: No upper extremity supported;Feet supported Sitting balance-Leahy Scale: Good     Standing balance support: Bilateral upper extremity supported;During functional activity Standing balance-Leahy Scale: Fair Standing balance comment: Pt demonstrated good ability to bear weight through BUE and LLE; compliant w/ WB status throughout.                            Cognition Arousal/Alertness: Awake/alert Behavior During Therapy: WFL for tasks assessed/performed Overall Cognitive Status: Within Functional Limits for tasks assessed                                        Exercises Total Joint Exercises Ankle Circles/Pumps: AROM;Both;10 reps Quad Sets: Strengthening;Both;10 reps Hip ABduction/ADduction: AROM;Strengthening;Right;10 reps Straight Leg Raises: AROM;Strengthening;Right;10 reps Long  Arc Quad: AROM;Strengthening;Right;10 reps;15 reps Knee Flexion: AROM;Strengthening;Right;10 reps;15 reps Goniometric ROM: R knee AROM: 0-79deg    General Comments        Pertinent Vitals/Pain Pain Assessment: 0-10 Pain Score: 4  Pain Location: R knee Pain Descriptors / Indicators: Sore;Aching Pain Intervention(s): Limited activity within patient's tolerance;Monitored during session    Home Living                      Prior  Function            PT Goals (current goals can now be found in the care plan section) Progress towards PT goals: Progressing toward goals    Frequency    BID      PT Plan Current plan remains appropriate    Co-evaluation              AM-PAC PT "6 Clicks" Mobility   Outcome Measure  Help needed turning from your back to your side while in a flat bed without using bedrails?: None Help needed moving from lying on your back to sitting on the side of a flat bed without using bedrails?: None Help needed moving to and from a bed to a chair (including a wheelchair)?: A Little Help needed standing up from a chair using your arms (e.g., wheelchair or bedside chair)?: A Little Help needed to walk in hospital room?: A Lot Help needed climbing 3-5 steps with a railing? : Total 6 Click Score: 17    End of Session Equipment Utilized During Treatment: Gait belt Activity Tolerance: Patient tolerated treatment well Patient left: Other (comment) (on BSC; pt told to ring nursing when finished) Nurse Communication: Mobility status;Weight bearing status PT Visit Diagnosis: Unsteadiness on feet (R26.81);Other abnormalities of gait and mobility (R26.89);Muscle weakness (generalized) (M62.81);History of falling (Z91.81);Pain Pain - Right/Left: Right Pain - part of body: Knee     Time: 1884-1660 PT Time Calculation (min) (ACUTE ONLY): 16 min  Charges:                        Florence Yeung SPT 02/10/20, 11:35 AM

## 2020-02-10 NOTE — Progress Notes (Signed)
Patient refuses VTE Lovenox injection. States she had injection by previous nurse. This nurse explains medication ordered BID, patient states she is not comfortable receiving medication at this time.

## 2020-02-11 NOTE — Progress Notes (Signed)
Physical Therapy Treatment Patient Details Name: Linda Ibarra MRN: [id number] DOB: Sep 22, 1940 Today's Date: 02/11/2020    History of Present Illness Pt is a 79 year old female w/ PMH of HTN, renal insufficiency, renal disease, arthritis, anemia, lumbar fusion, and L TKA in 2017. Per MD impression, pt presents with primary degenerative arthrosis of R knee as well as intraoperative findings of R medial tibial plateau fracture. Pt is now s/p R TKA and R ORIF of the medial tibial plateau.    PT Comments    Pt was pleasant and motivated to participate during the session. Pt reported unbearable pain earlier this a.m., down to 3/10 at time of session. Pt reported compliance w/ HEP and that she has been doing her exercises throughout the day and at night when she can't sleep. During today's session, pt R knee AROM measured as 0-80deg. Pt independent w/ supine/sitting exercises. Pt mod-I with bed mobility; required inc time for supine-to-sit. Pt able to sit-to-stand and ambulate 49ft to recliner w/ RW and CGA. Pt required min-cueing for compliance to foot flat TDWB precautions; demonstrated good use of BUE support on RW throughout ambulation. Pt will benefit from PT services in a SNF setting upon discharge to safely address deficits listed in patient problem list for decreased caregiver assistance and eventual return to PLOF.   Follow Up Recommendations  SNF     Equipment Recommendations  None recommended by PT    Recommendations for Other Services       Precautions / Restrictions Precautions Precautions: Fall Precaution Comments: Pt was able to perform R SLR x10: no KI required Restrictions Weight Bearing Restrictions: Yes RLE Weight Bearing: Touchdown weight bearing Other Position/Activity Restrictions: foot flat weight bearing    Mobility  Bed Mobility Overal bed mobility: Modified Independent             General bed mobility comments: supine-sit mod-I (time)  Transfers Overall  transfer level: Needs assistance Equipment used: Rolling walker (2 wheeled) Transfers: Sit to/from Stand Sit to Stand: Min guard         General transfer comment: Min VCs to maintain TDWB precautions  Ambulation/Gait Ambulation/Gait assistance: Min guard Gait Distance (Feet): 5 Feet Assistive device: Rolling walker (2 wheeled) Gait Pattern/deviations: Step-to pattern;Decreased step length - left;Decreased stance time - right Gait velocity: decreased   General Gait Details: Pt found in bed. Able to walk ~33ft to recliner including turns and backwards steps; min cueing to adhere to WB restrictions; pt demonstrated good use of BUE throughout   Stairs             Wheelchair Mobility    Modified Rankin (Stroke Patients Only)       Balance Overall balance assessment: Needs assistance Sitting-balance support: No upper extremity supported;Feet unsupported;Single extremity supported Sitting balance-Leahy Scale: Good Sitting balance - Comments: No LOB noted and pt able to maintain sitting w/ and w/o UE support   Standing balance support: Bilateral upper extremity supported;During functional activity Standing balance-Leahy Scale: Fair Standing balance comment: Pt demonstrated good ability to bear weight through BUE and LLE; compliant w/ WB status throughout.                            Cognition Arousal/Alertness: Awake/alert Behavior During Therapy: WFL for tasks assessed/performed Overall Cognitive Status: Within Functional Limits for tasks assessed  Exercises Total Joint Exercises Ankle Circles/Pumps: AROM;Both;10 reps Quad Sets: Strengthening;Both;10 reps Hip ABduction/ADduction: AROM;Strengthening;Right;15 reps Straight Leg Raises: AROM;Strengthening;Right;15 reps Long Arc Quad: AROM;Strengthening;Right;15 reps Knee Flexion: AROM;Strengthening;Right;15 reps Goniometric ROM: R knee AROM: 0-80deg     General Comments        Pertinent Vitals/Pain Pain Assessment: 0-10 Pain Score: 3  Pain Location: R knee Pain Descriptors / Indicators: Sore;Aching Pain Intervention(s): Limited activity within patient's tolerance;Monitored during session;Premedicated before session;Repositioned;Ice applied    Home Living                      Prior Function            PT Goals (current goals can now be found in the care plan section) Progress towards PT goals: Progressing toward goals    Frequency    BID      PT Plan Current plan remains appropriate    Co-evaluation              AM-PAC PT "6 Clicks" Mobility   Outcome Measure  Help needed turning from your back to your side while in a flat bed without using bedrails?: None Help needed moving from lying on your back to sitting on the side of a flat bed without using bedrails?: None Help needed moving to and from a bed to a chair (including a wheelchair)?: A Little Help needed standing up from a chair using your arms (e.g., wheelchair or bedside chair)?: A Little Help needed to walk in hospital room?: A Lot Help needed climbing 3-5 steps with a railing? : Total 6 Click Score: 17    End of Session Equipment Utilized During Treatment: Gait belt Activity Tolerance: Patient tolerated treatment well Patient left: in chair;with call bell/phone within reach;with chair alarm set;with SCD's reapplied;Other (comment) (polarcare reapplied) Nurse Communication: Mobility status;Weight bearing status PT Visit Diagnosis: Unsteadiness on feet (R26.81);Other abnormalities of gait and mobility (R26.89);Muscle weakness (generalized) (M62.81);History of falling (Z91.81);Pain Pain - Right/Left: Right Pain - part of body: Knee     Time: 9678-9381 PT Time Calculation (min) (ACUTE ONLY): 18 min  Charges:                        Hattye Siegfried SPT 02/11/20, 1:03 PM

## 2020-02-11 NOTE — TOC Progression Note (Signed)
Transition of Care [facility]) - Progression Note    Patient Details  Name: Linda Ibarra MRN: [id number] Date of Birth: 04-04-1941  Transition of [facility]) CM/SW Contact  Barrie Dunker, RN Phone Number: 02/11/2020, 11:21 AM  Clinical Narrative:   Spoke with [facility], they do not manage the patient they are managed by Francine Graven I notified Thayer Ohm with [facility] that they need to start [facility]         Expected Discharge Plan and Services           Expected Discharge Date: 02/10/20                                     Social Determinants of Health (SDOH) Interventions    Readmission Risk Interventions No flowsheet data found.

## 2020-02-11 NOTE — Progress Notes (Signed)
  Subjective: 3 Days Post-Op Procedure(s) (LRB): COMPUTER ASSISTED TOTAL KNEE ARTHROPLASTY  (Right) OPEN REDUCTION INTERNAL FIXATION (ORIF) TIBIAL PLATEAU (Right) Patient reports pain as well-controlled.   Patient is well, and has had no acute complaints or problems Plan is to go Skilled nursing facility after hospital stay. Negative for chest pain and shortness of breath Fever: no Gastrointestinal: negative for nausea and vomiting.   Patient has had a bowel movement.  Objective: Vital signs in last 24 hours: Temp:  [98 F (36.7 C)-98.2 F (36.8 C)] 98 F (36.7 C) (08/05 0818) Pulse Rate:  [65-67] 67 (08/05 0818) Resp:  [17-18] 18 (08/05 0818) BP: (99-148)/(64-69) 148/69 (08/05 0818) SpO2:  [98 %-100 %] 100 % (08/05 0818)  Intake/Output from previous day:  Intake/Output Summary (Last 24 hours) at 02/11/2020 0855 Last data filed at 02/10/2020 1838 Gross per 24 hour  Intake 480 ml  Output --  Net 480 ml    Intake/Output this shift: No intake/output data recorded.  Labs: No results for input(s): HGB in the last 72 hours. No results for input(s): WBC, RBC, HCT, PLT in the last 72 hours. No results for input(s): NA, K, CL, CO2, BUN, CREATININE, GLUCOSE, CALCIUM in the last 72 hours. No results for input(s): LABPT, INR in the last 72 hours.   EXAM General - Patient is Alert, Appropriate and Oriented Extremity - Neurovascular intact Dorsiflexion/Plantar flexion intact Compartment soft; Bone Foam at bedside  Dressing/Incision -clean, dry, no drainage, Polar Care in place and working.  Motor Function - intact, moving foot and toes well on exam.  Cardiovascular- Regular rate and rhythm, no murmurs/rubs/gallops Respiratory- Lungs clear to auscultation bilaterally Gastrointestinal- soft, nontender and active bowel sounds   Assessment/Plan: 3 Days Post-Op Procedure(s) (LRB): COMPUTER ASSISTED TOTAL KNEE ARTHROPLASTY  (Right) OPEN REDUCTION INTERNAL FIXATION (ORIF) TIBIAL  PLATEAU (Right) Active Problems:   Total knee replacement status  Estimated body mass index is 36.56 kg/m as calculated from the following:   Height as of 02/01/20: 4\' 11"  (1.499 m).   Weight as of 02/01/20: 82.1 kg. Advance diet Up with therapy Discharge to SNF pending insurance approval    DVT Prophylaxis - Lovenox, Ted hose and foot pumps Foot flat Weight-Bearing to right leg  02/03/20, PA-C [facility] 02/11/2020, 8:55 AM

## 2020-02-11 NOTE — Progress Notes (Signed)
Physical Therapy Treatment Patient Details Name: Linda Ibarra MRN: [id number] DOB: [birth year] Today's Date: 02/11/2020    History of Present Illness Pt is a 79 year old female w/ PMH of HTN, renal insufficiency, renal disease, arthritis, anemia, lumbar fusion, and L TKA in 2017. Per MD impression, pt presents with primary degenerative arthrosis of R knee as well as intraoperative findings of R medial tibial plateau fracture. Pt is now s/p R TKA and R ORIF of the medial tibial plateau.    PT Comments    Pt was pleasant and motivated to participate during the session. Pt reported feeling good and no pain this afternoon. Pt continued to be independent w/ seated exercises. Pt able to perform sit-to-stand w/ CGA and no verbal cues needed for sequencing this p.m.Marland Kitchen Pt required CGA for ambulation and demonstrated good use of BUE on RW throughout; able to walk 22ft to [facility] and 2ft back to recliner. Pt appeared somewhat forgetful of WB restrictions at times and would initiate stepping with the wrong foot; required mod-max cueing for sequencing and reminder of TDWB precautions. Pt responded well to cues and was able to respond and self-adjust each time; however, did require fairly frequent reminders. HR and SpO2 monitored and WFL throughout session. Pt will benefit from PT services in a SNF setting upon discharge to safely address deficits listed in patient problem list for decreased caregiver assistance and eventual return to PLOF.   Follow Up Recommendations  SNF     Equipment Recommendations  None recommended by PT    Recommendations for Other Services       Precautions / Restrictions Precautions Precautions: Fall Precaution Comments: Pt was able to perform R SLR x10: no KI required Restrictions Weight Bearing Restrictions: Yes RLE Weight Bearing: Touchdown weight bearing Other Position/Activity Restrictions: foot flat weight bearing    Mobility  Bed Mobility               General  bed mobility comments: pt found and returned to chair  Transfers Overall transfer level: Needs assistance Equipment used: Rolling walker (2 wheeled) Transfers: Sit to/from Stand Sit to Stand: Min guard         General transfer comment: Min VCs to maintain TDWB precautions  Ambulation/Gait Ambulation/Gait assistance: Min guard Gait Distance (Feet): 5 Feet x2 Assistive device: Rolling walker (2 wheeled) Gait Pattern/deviations: Step-to pattern;Decreased step length - left;Decreased stance time - right Gait velocity: decreased   General Gait Details: Pt found in chair. Able to walk ~88ft to [facility] and 38ft back including turns and backwards steps; min cueing to adhere to WB restrictions; pt demonstrated good use of BUE throughout   Stairs             Wheelchair Mobility    Modified Rankin (Stroke Patients Only)       Balance Overall balance assessment: Needs assistance Sitting-balance support: Feet unsupported;Single extremity supported Sitting balance-Leahy Scale: Good     Standing balance support: Bilateral upper extremity supported;During functional activity Standing balance-Leahy Scale: Fair Standing balance comment: Pt demonstrated good ability to bear weight through BUE and LLE; compliant w/ WB status throughout.                            Cognition Arousal/Alertness: Awake/alert Behavior During Therapy: WFL for tasks assessed/performed Overall Cognitive Status: Within Functional Limits for tasks assessed  General Comments: A&O x4      Exercises Total Joint Exercises Ankle Circles/Pumps: AROM;Both;10 reps Quad Sets: Strengthening;Both;10 reps Gluteal Sets: Strengthening;Both;10 reps Hip ABduction/ADduction: AROM;Strengthening;Right;15 reps Straight Leg Raises: AROM;Strengthening;Right;15 reps Long Arc Quad: AROM;Strengthening;Right;15 reps Knee Flexion: AROM;Strengthening;Right;15 reps Marching in  Standing: AROM;Left;Standing;5 reps    General Comments        Pertinent Vitals/Pain Pain Assessment: No/denies pain    Home Living                      Prior Function            PT Goals (current goals can now be found in the care plan section) Progress towards PT goals: Progressing toward goals    Frequency    BID      PT Plan Current plan remains appropriate    Co-evaluation              AM-PAC PT "6 Clicks" Mobility   Outcome Measure  Help needed turning from your back to your side while in a flat bed without using bedrails?: None Help needed moving from lying on your back to sitting on the side of a flat bed without using bedrails?: None Help needed moving to and from a bed to a chair (including a wheelchair)?: A Little Help needed standing up from a chair using your arms (e.g., wheelchair or bedside chair)?: A Little Help needed to walk in hospital room?: A Lot Help needed climbing 3-5 steps with a railing? : Total 6 Click Score: 17    End of Session Equipment Utilized During Treatment: Gait belt Activity Tolerance: Patient tolerated treatment well Patient left: in chair;with call bell/phone within reach;with chair alarm set;with SCD's reapplied;Other (comment) (polarcare reapplied) Nurse Communication: Mobility status;Weight bearing status PT Visit Diagnosis: Unsteadiness on feet (R26.81);Other abnormalities of gait and mobility (R26.89);Muscle weakness (generalized) (M62.81);History of falling (Z91.81);Pain Pain - Right/Left: Right Pain - part of body: Knee     Time: 1414-1440 PT Time Calculation (min) (ACUTE ONLY): 26 min  Charges:                        Sevanna Ballengee SPT 02/11/20, 3:29 PM

## 2020-02-11 NOTE — TOC Progression Note (Addendum)
Transition of Care [facility]) - Progression Note    Patient Details  Name: Linda Ibarra MRN: [id number] Date of Birth: [birth year]  Transition of Care [facility]) CM/SW Contact  Barrie Dunker, RN Phone Number: 02/11/2020, 8:44 AM  Clinical Narrative:     Checked the status of the insurance auth, it is still pending, awaiting approval to go to [facility], they have received clinicals and it is being reviewed by April, I explained that this is holding up DC. She stated that they are not sure that they manage the member, they have reached out to [facility] to get eligibility check, I asked if someone will call me back to let me know, she stated that they would       Expected Discharge Plan and Services           Expected Discharge Date: 02/10/20                                     Social Determinants of Health (SDOH) Interventions    Readmission Risk Interventions No flowsheet data found.

## 2020-02-12 DIAGNOSIS — H409 Unspecified glaucoma: Secondary | ICD-10-CM | POA: Diagnosis not present

## 2020-02-12 DIAGNOSIS — D649 Anemia, unspecified: Secondary | ICD-10-CM | POA: Diagnosis not present

## 2020-02-12 DIAGNOSIS — M1711 Unilateral primary osteoarthritis, right knee: Secondary | ICD-10-CM | POA: Diagnosis not present

## 2020-02-12 DIAGNOSIS — Z4733 Aftercare following explantation of knee joint prosthesis: Secondary | ICD-10-CM | POA: Diagnosis not present

## 2020-02-12 DIAGNOSIS — Z96651 Presence of right artificial knee joint: Secondary | ICD-10-CM | POA: Diagnosis not present

## 2020-02-12 DIAGNOSIS — Z7401 Bed confinement status: Secondary | ICD-10-CM | POA: Diagnosis not present

## 2020-02-12 DIAGNOSIS — M2550 Pain in unspecified joint: Secondary | ICD-10-CM | POA: Diagnosis not present

## 2020-02-12 DIAGNOSIS — M159 Polyosteoarthritis, unspecified: Secondary | ICD-10-CM | POA: Diagnosis not present

## 2020-02-12 DIAGNOSIS — R5381 Other malaise: Secondary | ICD-10-CM | POA: Diagnosis not present

## 2020-02-12 DIAGNOSIS — E569 Vitamin deficiency, unspecified: Secondary | ICD-10-CM | POA: Diagnosis not present

## 2020-02-12 DIAGNOSIS — R29898 Other symptoms and signs involving the musculoskeletal system: Secondary | ICD-10-CM | POA: Diagnosis not present

## 2020-02-12 DIAGNOSIS — H401130 Primary open-angle glaucoma, bilateral, stage unspecified: Secondary | ICD-10-CM | POA: Diagnosis not present

## 2020-02-12 DIAGNOSIS — M25561 Pain in right knee: Secondary | ICD-10-CM | POA: Diagnosis not present

## 2020-02-12 DIAGNOSIS — I10 Essential (primary) hypertension: Secondary | ICD-10-CM | POA: Diagnosis not present

## 2020-02-12 DIAGNOSIS — E785 Hyperlipidemia, unspecified: Secondary | ICD-10-CM | POA: Diagnosis not present

## 2020-02-12 DIAGNOSIS — M25661 Stiffness of right knee, not elsewhere classified: Secondary | ICD-10-CM | POA: Diagnosis not present

## 2020-02-12 NOTE — Progress Notes (Signed)
Physical Therapy Treatment Patient Details Name: Linda Ibarra MRN: [id number] DOB: 01/18/1941 Today's Date: 02/12/2020    History of Present Illness Pt is a 79 year old female w/ PMH of HTN, renal insufficiency, renal disease, arthritis, anemia, lumbar fusion, and L TKA in 2017. Per MD impression, pt presents with primary degenerative arthrosis of R knee as well as intraoperative findings of R medial tibial plateau fracture. Pt is now s/p R TKA and R ORIF of the medial tibial plateau.    PT Comments    Pt was pleasant and motivated to participate during the session. Pt reported no pain at beginning of session. R knee AROM measured as 0-80deg. Pt able to independently perform supine/sitting exercises and reported compliance w/ HEP. Pt mod-I with supine-sit and required extra time. Pt needed supervision w/ sit-to-stand; demonstrated excellent sequencing w/ no need for cueing. Pt able to ambulate 45ft to recliner, rest, and then ambulate 75ft forwards and 61ft backwards back to recliner. Pt demonstrated good use of BUE on RW; however, continued to require mod cueing for sequencing and adherence to WB restrictions. HR and SpO2 monitored throughout session and remained WFL. Pt will benefit from PT services in a SNF setting upon discharge to safely address deficits listed in patient problem list for decreased caregiver assistance and eventual return to PLOF.   Follow Up Recommendations  SNF     Equipment Recommendations  None recommended by PT    Recommendations for Other Services       Precautions / Restrictions Precautions Precautions: Fall Precaution Comments: Pt was able to perform R SLR x10: no KI required Restrictions Weight Bearing Restrictions: Yes RLE Weight Bearing: Touchdown weight bearing Other Position/Activity Restrictions: foot flat weight bearing    Mobility  Bed Mobility Overal bed mobility: Modified Independent             General bed mobility comments: supine-sit  mod-I inc time  Transfers Overall transfer level: Needs assistance Equipment used: Rolling walker (2 wheeled) Transfers: Sit to/from Stand Sit to Stand: Supervision         General transfer comment: pt had great positioning for sit-to-stand; no VCs needed  Ambulation/Gait Ambulation/Gait assistance: Min guard Gait Distance (Feet): 10 Feet; 4 Feet Assistive device: Rolling walker (2 wheeled) Gait Pattern/deviations: Step-to pattern;Decreased step length - left;Decreased stance time - right Gait velocity: decreased   General Gait Details: Pt able to walk 35ft bed-chair then 24ft forwards and 50ft backwards from chair. Pt continued to require mod cueing to adhere to WB restrictions; good use of BUE on RW throughout.   Stairs             Wheelchair Mobility    Modified Rankin (Stroke Patients Only)       Balance Overall balance assessment: Needs assistance Sitting-balance support: No upper extremity supported;Feet supported Sitting balance-Leahy Scale: Good Sitting balance - Comments: Pt able to sit w/ feet supported and no UE support for ~70min; no LOB   Standing balance support: Bilateral upper extremity supported;During functional activity Standing balance-Leahy Scale: Fair Standing balance comment: Pt demonstrated good ability to bear weight through BUE and LLE; compliant w/ WB status throughout.                            Cognition Arousal/Alertness: Awake/alert Behavior During Therapy: WFL for tasks assessed/performed Overall Cognitive Status: Within Functional Limits for tasks assessed  Exercises Total Joint Exercises Ankle Circles/Pumps: AROM;Both;10 reps Quad Sets: Strengthening;Both;10 reps Gluteal Sets: Strengthening;Both;10 reps Hip ABduction/ADduction: AROM;Strengthening;Right;15 reps Straight Leg Raises: AROM;Strengthening;Right;15 reps Long Arc Quad: AROM;Strengthening;Right;15  reps Knee Flexion: AROM;Strengthening;Right;15 reps Goniometric ROM: R knee AROM: 0-80deg Marching in Standing: AROM;Left;Standing;5 reps    General Comments        Pertinent Vitals/Pain Pain Assessment: No/denies pain    Home Living                      Prior Function            PT Goals (current goals can now be found in the care plan section) Progress towards PT goals: Progressing toward goals    Frequency    BID      PT Plan Current plan remains appropriate    Co-evaluation              AM-PAC PT "6 Clicks" Mobility   Outcome Measure  Help needed turning from your back to your side while in a flat bed without using bedrails?: None Help needed moving from lying on your back to sitting on the side of a flat bed without using bedrails?: None Help needed moving to and from a bed to a chair (including a wheelchair)?: A Little Help needed standing up from a chair using your arms (e.g., wheelchair or bedside chair)?: A Little Help needed to walk in hospital room?: A Lot Help needed climbing 3-5 steps with a railing? : Total 6 Click Score: 17    End of Session Equipment Utilized During Treatment: Gait belt Activity Tolerance: Patient tolerated treatment well Patient left: in chair;with call bell/phone within reach;with chair alarm set;with SCD's reapplied;Other (comment) (polarcare reapplied) Nurse Communication: Mobility status;Weight bearing status PT Visit Diagnosis: Unsteadiness on feet (R26.81);Other abnormalities of gait and mobility (R26.89);Muscle weakness (generalized) (M62.81);History of falling (Z91.81);Pain Pain - Right/Left: Right Pain - part of body: Knee     Time: 5638-7564 PT Time Calculation (min) (ACUTE ONLY): 32 min  Charges:                        Zhanae Proffit SPT 02/12/20, 1:32 PM

## 2020-02-12 NOTE — Progress Notes (Signed)
Correction to previous note. IV removed from RFA

## 2020-02-12 NOTE — Progress Notes (Signed)
  Subjective: 4 Days Post-Op Procedure(s) (LRB): COMPUTER ASSISTED TOTAL KNEE ARTHROPLASTY  (Right) OPEN REDUCTION INTERNAL FIXATION (ORIF) TIBIAL PLATEAU (Right) Patient reports pain as mild.   Patient is well, but has had some minor complaints of coughing up phlegm.  Plan is to go Skilled nursing facility after hospital stay. Negative for chest pain and shortness of breath Fever: no Gastrointestinal: negative for nausea and vomiting.   Patient has had a bowel movement.  Objective: Vital signs in last 24 hours: Temp:  [97.7 F (36.5 C)-98.9 F (37.2 C)] 98.5 F (36.9 C) (08/06 0736) Pulse Rate:  [65-80] 75 (08/06 0736) Resp:  [14-17] 17 (08/06 0736) BP: (131-145)/(67-75) 141/70 (08/06 0736) SpO2:  [95 %-100 %] 95 % (08/06 0736)  Intake/Output from previous day:  Intake/Output Summary (Last 24 hours) at 02/12/2020 0916 Last data filed at 02/11/2020 1310 Gross per 24 hour  Intake 720 ml  Output --  Net 720 ml    Intake/Output this shift: No intake/output data recorded.  Labs: No results for input(s): HGB in the last 72 hours. No results for input(s): WBC, RBC, HCT, PLT in the last 72 hours. No results for input(s): NA, K, CL, CO2, BUN, CREATININE, GLUCOSE, CALCIUM in the last 72 hours. No results for input(s): LABPT, INR in the last 72 hours.   EXAM General - Patient is Alert, Appropriate and Oriented Extremity - Neurovascular intact Dorsiflexion/Plantar flexion intact Compartment soft Dressing/Incision -clean, dry, no drainage, Polar Care in place and working.  Motor Function - intact, moving foot and toes well on exam.  Cardiovascular- Regular rate and rhythm, no murmurs/rubs/gallops Respiratory- Lungs clear to auscultation bilaterally Gastrointestinal- soft, nontender and active bowel sounds   Assessment/Plan: 4 Days Post-Op Procedure(s) (LRB): COMPUTER ASSISTED TOTAL KNEE ARTHROPLASTY  (Right) OPEN REDUCTION INTERNAL FIXATION (ORIF) TIBIAL PLATEAU  (Right) Active Problems:   Total knee replacement status  Estimated body mass index is 36.56 kg/m as calculated from the following:   Height as of 02/01/20: 4\' 11"  (1.499 m).   Weight as of 02/01/20: 82.1 kg. Advance diet Up with therapy Discharge to SNF pending insurance approval.    DVT Prophylaxis - Lovenox, Ted hose and foot pumps Foot flat wightbearing  to right leg  02/03/20, PA-C [facility] 02/12/2020, 9:16 AM

## 2020-02-12 NOTE — Plan of Care (Signed)

## 2020-02-12 NOTE — TOC Progression Note (Signed)
Transition of Care [facility]) - Progression Note    Patient Details  Name: Linda Ibarra MRN: [id number] Date of Birth: 08-05-1940  Transition of Care [facility]) CM/SW Contact  Barrie Dunker, RN Phone Number: 02/12/2020, 9:23 AM  Clinical Narrative:    Reached out to [facility] to inquire about auth status, still Pending        Expected Discharge Plan and Services           Expected Discharge Date: 02/10/20                                     Social Determinants of Health (SDOH) Interventions    Readmission Risk Interventions No flowsheet data found.

## 2020-02-12 NOTE — Care Management Important Message (Signed)
Important Message  Patient Details  Name: Linda Ibarra MRN: [id number] Date of Birth: 06-09-1941   Medicare Important Message Given:  Yes     [name] 02/12/2020, 10:41 AM

## 2020-02-12 NOTE — TOC Progression Note (Signed)
Transition of Care [facility]) - Progression Note    Patient Details  Name: Linda Ibarra MRN: [id number] Date of Birth: 1940-12-05  Transition of Care [facility]) CM/SW Contact  Barrie Dunker, RN Phone Number: 02/12/2020, 11:19 AM  Clinical Narrative:   TOC called EMS to pick up patient at 130 to transport to [facility] as requested by [facility]         Expected Discharge Plan and Services           Expected Discharge Date: 02/10/20                                     Social Determinants of Health (SDOH) Interventions    Readmission Risk Interventions No flowsheet data found.

## 2020-02-12 NOTE — TOC Progression Note (Signed)
Transition of Care [facility]) - Progression Note    Patient Details  Name: Linda Ibarra MRN: [id number] Date of Birth: 12-13-1940  Transition of Care [facility]) CM/SW Contact  Barrie Dunker, RN Phone Number: 02/12/2020, 10:05 AM  Clinical Narrative:    Spoke with Tammy at [facility], they have gotten insurance approval, the patient is fully vaccinated, She will go to room 612A, DC packet placed on the chart, Bedside Nurse made aware and to call Report, TOC CM to call EMS when ready        Expected Discharge Plan and Services           Expected Discharge Date: 02/10/20                                     Social Determinants of Health (SDOH) Interventions    Readmission Risk Interventions No flowsheet data found.

## 2020-02-12 NOTE — Progress Notes (Signed)
Patient transported via [facility] to [facility] via stretcher. Discharge summary and personal belongings sent with patient and her husband. IV removed From LFA .

## 2020-02-12 NOTE — Progress Notes (Signed)
Occupational Therapy Treatment Patient Details Name: Linda Ibarra MRN: [id number] DOB: 06-22-1941 Today's Date: 02/12/2020    History of present illness Pt is a 79 year old female w/ PMH of HTN, renal insufficiency, renal disease, arthritis, anemia, lumbar fusion, and L TKA in 2017. Per MD impression, pt presents with primary degenerative arthrosis of R knee as well as intraoperative findings of R medial tibial plateau fracture. Pt is now s/p R TKA and R ORIF of the medial tibial plateau.   OT comments  Linda Ibarra was seen for OT treatment on this date. Upon arrival to room pt was seated EOC requesting toileting and return to bed. Pt required CGA + RW + VCs to maintain WBing pcns for BSC t/f ambulating ~33ft chair>BSC>bed. SUPERVISION for pericare at [facility] c lateral leans. Pt verbalized understanding of instruction provided. Pt making good progress toward goals. Pt continues to benefit from skilled OT services to maximize return to PLOF and minimize risk of future falls, injury, caregiver burden, and readmission. Will continue to follow POC. Discharge recommendation remains appropriate.    Follow Up Recommendations  SNF    Equipment Recommendations   (TBD)    Recommendations for Other Services      Precautions / Restrictions Precautions Precautions: Fall Precaution Comments: Pt was able to perform R SLR x10: no KI required Restrictions Weight Bearing Restrictions: Yes RLE Weight Bearing: Touchdown weight bearing Other Position/Activity Restrictions: foot flat weight bearing       Mobility Bed Mobility Overal bed mobility: Modified Independent             General bed mobility comments: supine-sit mod-I inc time  Transfers Overall transfer level: Needs assistance Equipment used: Rolling walker (2 wheeled) Transfers: Sit to/from Stand Sit to Stand: Min guard         General transfer comment: SBA + RW sit<>Stand     Balance Overall balance assessment: Needs  assistance Sitting-balance support: No upper extremity supported;Feet supported Sitting balance-Leahy Scale: Good Sitting balance - Comments: Pt able to sit w/ feet supported and no UE support for ~64min; no LOB   Standing balance support: Bilateral upper extremity supported;During functional activity Standing balance-Leahy Scale: Fair Standing balance comment: Pt demonstrated good ability to bear weight through BUE and LLE; compliant w/ WB status throughout.                           ADL either performed or assessed with clinical judgement   ADL Overall ADL's : Needs assistance/impaired                                       General ADL Comments: CGA + RW + VCs to maintain WBing pcns for BSC t/f. SUPERVISION for pericare at [facility] c leans.      Vision       Perception     Praxis      Cognition Arousal/Alertness: Awake/alert Behavior During Therapy: WFL for tasks assessed/performed Overall Cognitive Status: Within Functional Limits for tasks assessed                                 General Comments: A&O x4        Exercises Exercises: Other exercises Total Joint Exercises Ankle Circles/Pumps: AROM;Both;10 reps Quad Sets: Strengthening;Both;10 reps Gluteal Sets: Strengthening;Both;10 reps  Hip ABduction/ADduction: AROM;Strengthening;Right;15 reps Straight Leg Raises: AROM;Strengthening;Right;15 reps Long Arc Quad: AROM;Strengthening;Right;15 reps Knee Flexion: AROM;Strengthening;Right;15 reps Goniometric ROM: R knee AROM: 0-80deg Marching in Standing: AROM;Left;Standing;5 reps Other Exercises Other Exercises: Pt educated re: falls prevention, d/c recs, importance of supervision for mobility, RW technique, functional application of WBing pcns Other Exercises: Toileting including pericare, LBD, sit<>stand, sit>sup, sitting/standing balance/tolerance   Shoulder Instructions       General Comments      Pertinent Vitals/ Pain        Pain Assessment: No/denies pain  Home Living Family/patient expects to be discharged to:: Private residence                                        Prior Functioning/Environment              Frequency  Min 1X/week        Progress Toward Goals  OT Goals(current goals can now be found in the care plan section)  Progress towards OT goals: Progressing toward goals  Acute Rehab OT Goals Patient Stated Goal: to get better and go home OT Goal Formulation: With patient Time For Goal Achievement: 02/23/20 Potential to Achieve Goals: Good ADL Goals Pt Will Perform Grooming: with modified independence;sitting Pt Will Perform Lower Body Dressing: with supervision;sit to/from stand;with caregiver independent in assisting;with adaptive equipment ( c LRAD ) Pt Will Transfer to Toilet: with supervision;ambulating;bedside commode (c LRAD PRN & MIN VCs for WBing pcns) Pt Will Perform Toileting - Clothing Manipulation and hygiene: with modified independence;sitting/lateral leans  Plan Discharge plan remains appropriate;Frequency remains appropriate    Co-evaluation                 AM-PAC OT "6 Clicks" Daily Activity     Outcome Measure   Help from another person eating meals?: None Help from another person taking care of personal grooming?: None Help from another person toileting, which includes using toliet, bedpan, or urinal?: A Little Help from another person bathing (including washing, rinsing, drying)?: A Lot Help from another person to put on and taking off regular upper body clothing?: A Little Help from another person to put on and taking off regular lower body clothing?: A Lot 6 Click Score: 18    End of Session Equipment Utilized During Treatment: Rolling walker  OT Visit Diagnosis: Other abnormalities of gait and mobility (R26.89)   Activity Tolerance Patient tolerated treatment well   Patient Left in bed;with call bell/phone within reach;with  bed alarm set   Nurse Communication          Time: 5427-0623 OT Time Calculation (min): 13 min  Charges: OT General Charges $OT Visit: 1 Visit OT Treatments $Self Care/Home Management : 8-22 mins  Kathie Dike, M.S. OTR/L  02/12/20, 12:24 PM  ascom [phone]

## 2020-02-16 DIAGNOSIS — H409 Unspecified glaucoma: Secondary | ICD-10-CM | POA: Diagnosis not present

## 2020-02-16 DIAGNOSIS — I10 Essential (primary) hypertension: Secondary | ICD-10-CM | POA: Diagnosis not present

## 2020-02-16 DIAGNOSIS — Z96651 Presence of right artificial knee joint: Secondary | ICD-10-CM | POA: Diagnosis not present

## 2020-02-16 DIAGNOSIS — M25561 Pain in right knee: Secondary | ICD-10-CM | POA: Diagnosis not present

## 2020-02-16 DIAGNOSIS — M159 Polyosteoarthritis, unspecified: Secondary | ICD-10-CM | POA: Diagnosis not present

## 2020-02-23 DIAGNOSIS — R29898 Other symptoms and signs involving the musculoskeletal system: Secondary | ICD-10-CM | POA: Diagnosis not present

## 2020-02-23 DIAGNOSIS — Z96651 Presence of right artificial knee joint: Secondary | ICD-10-CM | POA: Diagnosis not present

## 2020-02-23 DIAGNOSIS — M1711 Unilateral primary osteoarthritis, right knee: Secondary | ICD-10-CM | POA: Diagnosis not present

## 2020-02-23 DIAGNOSIS — M25561 Pain in right knee: Secondary | ICD-10-CM | POA: Diagnosis not present

## 2020-02-23 DIAGNOSIS — M25661 Stiffness of right knee, not elsewhere classified: Secondary | ICD-10-CM | POA: Diagnosis not present

## 2020-02-26 DIAGNOSIS — M25561 Pain in right knee: Secondary | ICD-10-CM | POA: Diagnosis not present

## 2020-02-26 DIAGNOSIS — Z96651 Presence of right artificial knee joint: Secondary | ICD-10-CM | POA: Diagnosis not present

## 2020-02-29 DIAGNOSIS — Z96651 Presence of right artificial knee joint: Secondary | ICD-10-CM | POA: Diagnosis not present

## 2020-02-29 DIAGNOSIS — M25561 Pain in right knee: Secondary | ICD-10-CM | POA: Diagnosis not present

## 2020-03-03 DIAGNOSIS — Z96651 Presence of right artificial knee joint: Secondary | ICD-10-CM | POA: Diagnosis not present

## 2020-03-03 DIAGNOSIS — M25561 Pain in right knee: Secondary | ICD-10-CM | POA: Diagnosis not present

## 2020-03-08 DIAGNOSIS — Z96651 Presence of right artificial knee joint: Secondary | ICD-10-CM | POA: Diagnosis not present

## 2020-03-08 DIAGNOSIS — M25561 Pain in right knee: Secondary | ICD-10-CM | POA: Diagnosis not present

## 2020-03-10 DIAGNOSIS — Z96651 Presence of right artificial knee joint: Secondary | ICD-10-CM | POA: Diagnosis not present

## 2020-03-10 DIAGNOSIS — M25561 Pain in right knee: Secondary | ICD-10-CM | POA: Diagnosis not present

## 2020-03-15 DIAGNOSIS — M25561 Pain in right knee: Secondary | ICD-10-CM | POA: Diagnosis not present

## 2020-03-15 DIAGNOSIS — Z96651 Presence of right artificial knee joint: Secondary | ICD-10-CM | POA: Diagnosis not present

## 2020-03-17 DIAGNOSIS — M25561 Pain in right knee: Secondary | ICD-10-CM | POA: Diagnosis not present

## 2020-03-17 DIAGNOSIS — Z96651 Presence of right artificial knee joint: Secondary | ICD-10-CM | POA: Diagnosis not present

## 2020-03-21 DIAGNOSIS — M25561 Pain in right knee: Secondary | ICD-10-CM | POA: Diagnosis not present

## 2020-03-21 DIAGNOSIS — Z96651 Presence of right artificial knee joint: Secondary | ICD-10-CM | POA: Diagnosis not present

## 2020-03-22 DIAGNOSIS — Z96651 Presence of right artificial knee joint: Secondary | ICD-10-CM | POA: Diagnosis not present

## 2020-03-22 DIAGNOSIS — H40003 Preglaucoma, unspecified, bilateral: Secondary | ICD-10-CM | POA: Diagnosis not present

## 2020-03-24 DIAGNOSIS — Z96651 Presence of right artificial knee joint: Secondary | ICD-10-CM | POA: Diagnosis not present

## 2020-03-24 DIAGNOSIS — M25561 Pain in right knee: Secondary | ICD-10-CM | POA: Diagnosis not present

## 2020-03-29 DIAGNOSIS — Z96651 Presence of right artificial knee joint: Secondary | ICD-10-CM | POA: Diagnosis not present

## 2020-03-29 DIAGNOSIS — M25561 Pain in right knee: Secondary | ICD-10-CM | POA: Diagnosis not present

## 2020-03-31 DIAGNOSIS — M25561 Pain in right knee: Secondary | ICD-10-CM | POA: Diagnosis not present

## 2020-03-31 DIAGNOSIS — Z96651 Presence of right artificial knee joint: Secondary | ICD-10-CM | POA: Diagnosis not present

## 2020-04-05 DIAGNOSIS — M25561 Pain in right knee: Secondary | ICD-10-CM | POA: Diagnosis not present

## 2020-04-05 DIAGNOSIS — Z96651 Presence of right artificial knee joint: Secondary | ICD-10-CM | POA: Diagnosis not present

## 2020-04-07 DIAGNOSIS — M25561 Pain in right knee: Secondary | ICD-10-CM | POA: Diagnosis not present

## 2020-04-07 DIAGNOSIS — Z96651 Presence of right artificial knee joint: Secondary | ICD-10-CM | POA: Diagnosis not present

## 2020-04-15 DIAGNOSIS — Z96651 Presence of right artificial knee joint: Secondary | ICD-10-CM | POA: Diagnosis not present

## 2020-04-15 DIAGNOSIS — M25561 Pain in right knee: Secondary | ICD-10-CM | POA: Diagnosis not present

## 2020-05-10 DIAGNOSIS — S82141D Displaced bicondylar fracture of right tibia, subsequent encounter for closed fracture with routine healing: Secondary | ICD-10-CM | POA: Diagnosis not present

## 2020-05-10 DIAGNOSIS — Z96651 Presence of right artificial knee joint: Secondary | ICD-10-CM | POA: Diagnosis not present

## 2020-06-08 DIAGNOSIS — H40053 Ocular hypertension, bilateral: Secondary | ICD-10-CM | POA: Diagnosis not present

## 2020-07-26 DIAGNOSIS — I10 Essential (primary) hypertension: Secondary | ICD-10-CM | POA: Diagnosis not present

## 2020-07-26 DIAGNOSIS — E7800 Pure hypercholesterolemia, unspecified: Secondary | ICD-10-CM | POA: Diagnosis not present

## 2020-08-01 DIAGNOSIS — H2511 Age-related nuclear cataract, right eye: Secondary | ICD-10-CM | POA: Diagnosis not present

## 2020-08-01 DIAGNOSIS — I10 Essential (primary) hypertension: Secondary | ICD-10-CM | POA: Diagnosis not present

## 2020-08-02 ENCOUNTER — Encounter: Payer: Self-pay | Admitting: Ophthalmology

## 2020-08-02 ENCOUNTER — Other Ambulatory Visit: Payer: Self-pay

## 2020-08-02 ENCOUNTER — Encounter: Payer: Self-pay | Admitting: Anesthesiology

## 2020-08-02 ENCOUNTER — Other Ambulatory Visit: Payer: Self-pay | Admitting: Internal Medicine

## 2020-08-02 DIAGNOSIS — N183 Chronic kidney disease, stage 3 unspecified: Secondary | ICD-10-CM | POA: Diagnosis not present

## 2020-08-02 DIAGNOSIS — E7800 Pure hypercholesterolemia, unspecified: Secondary | ICD-10-CM | POA: Diagnosis not present

## 2020-08-02 DIAGNOSIS — Z0001 Encounter for general adult medical examination with abnormal findings: Secondary | ICD-10-CM | POA: Diagnosis not present

## 2020-08-02 DIAGNOSIS — Z0000 Encounter for general adult medical examination without abnormal findings: Secondary | ICD-10-CM | POA: Diagnosis not present

## 2020-08-02 DIAGNOSIS — Z1231 Encounter for screening mammogram for malignant neoplasm of breast: Secondary | ICD-10-CM

## 2020-08-02 DIAGNOSIS — I129 Hypertensive chronic kidney disease with stage 1 through stage 4 chronic kidney disease, or unspecified chronic kidney disease: Secondary | ICD-10-CM | POA: Diagnosis not present

## 2020-08-02 DIAGNOSIS — H401130 Primary open-angle glaucoma, bilateral, stage unspecified: Secondary | ICD-10-CM | POA: Diagnosis not present

## 2020-08-03 DIAGNOSIS — H18892 Other specified disorders of cornea, left eye: Secondary | ICD-10-CM | POA: Diagnosis not present

## 2020-08-05 ENCOUNTER — Other Ambulatory Visit: Admission: RE | Admit: 2020-08-05 | Payer: Medicare HMO | Source: Ambulatory Visit

## 2020-08-09 ENCOUNTER — Ambulatory Visit: Admission: RE | Admit: 2020-08-09 | Payer: Medicare HMO | Source: Home / Self Care | Admitting: Ophthalmology

## 2020-08-09 HISTORY — DX: Presence of dental prosthetic device (complete) (partial): Z97.2

## 2020-08-09 SURGERY — PHACOEMULSIFICATION, CATARACT, WITH IOL INSERTION
Anesthesia: Topical | Laterality: Right

## 2020-08-11 DIAGNOSIS — Z96652 Presence of left artificial knee joint: Secondary | ICD-10-CM | POA: Diagnosis not present

## 2020-08-11 DIAGNOSIS — M170 Bilateral primary osteoarthritis of knee: Secondary | ICD-10-CM | POA: Diagnosis not present

## 2020-08-11 DIAGNOSIS — Z96651 Presence of right artificial knee joint: Secondary | ICD-10-CM | POA: Diagnosis not present

## 2020-08-18 DIAGNOSIS — M3501 Sicca syndrome with keratoconjunctivitis: Secondary | ICD-10-CM | POA: Diagnosis not present

## 2020-08-19 ENCOUNTER — Inpatient Hospital Stay: Admission: RE | Admit: 2020-08-19 | Payer: Medicare HMO | Source: Ambulatory Visit

## 2020-08-23 ENCOUNTER — Ambulatory Visit: Admission: RE | Admit: 2020-08-23 | Payer: Medicare HMO | Source: Home / Self Care | Admitting: Ophthalmology

## 2020-08-23 ENCOUNTER — Encounter: Admission: RE | Payer: Self-pay | Source: Home / Self Care

## 2020-08-23 SURGERY — PHACOEMULSIFICATION, CATARACT, WITH IOL INSERTION
Anesthesia: Topical | Laterality: Left

## 2020-09-14 ENCOUNTER — Other Ambulatory Visit: Payer: Self-pay

## 2020-09-14 ENCOUNTER — Ambulatory Visit
Admission: RE | Admit: 2020-09-14 | Discharge: 2020-09-14 | Disposition: A | Discharge status: Home / Self Care | Payer: Medicare HMO | Source: Ambulatory Visit | Attending: Internal Medicine | Admitting: Internal Medicine

## 2020-09-14 DIAGNOSIS — Z1231 Encounter for screening mammogram for malignant neoplasm of breast: Secondary | ICD-10-CM | POA: Diagnosis not present

## 2020-09-16 DIAGNOSIS — H18892 Other specified disorders of cornea, left eye: Secondary | ICD-10-CM | POA: Diagnosis not present

## 2020-09-20 DIAGNOSIS — H40003 Preglaucoma, unspecified, bilateral: Secondary | ICD-10-CM | POA: Diagnosis not present

## 2020-09-27 DIAGNOSIS — H2513 Age-related nuclear cataract, bilateral: Secondary | ICD-10-CM | POA: Diagnosis not present

## 2020-11-22 DIAGNOSIS — M545 Low back pain, unspecified: Secondary | ICD-10-CM | POA: Diagnosis not present

## 2020-11-22 DIAGNOSIS — R300 Dysuria: Secondary | ICD-10-CM | POA: Diagnosis not present

## 2020-11-22 DIAGNOSIS — M5489 Other dorsalgia: Secondary | ICD-10-CM | POA: Diagnosis not present

## 2020-12-09 ENCOUNTER — Other Ambulatory Visit: Payer: Self-pay

## 2020-12-09 ENCOUNTER — Emergency Department: Payer: Medicare HMO

## 2020-12-09 ENCOUNTER — Emergency Department
Admission: EM | Admit: 2020-12-09 | Discharge: 2020-12-09 | Disposition: A | Discharge status: Home / Self Care | Payer: Medicare HMO | Attending: Emergency Medicine | Admitting: Emergency Medicine

## 2020-12-09 DIAGNOSIS — Z96653 Presence of artificial knee joint, bilateral: Secondary | ICD-10-CM | POA: Diagnosis not present

## 2020-12-09 DIAGNOSIS — Z79899 Other long term (current) drug therapy: Secondary | ICD-10-CM | POA: Diagnosis not present

## 2020-12-09 DIAGNOSIS — R42 Dizziness and giddiness: Secondary | ICD-10-CM | POA: Insufficient documentation

## 2020-12-09 DIAGNOSIS — N183 Chronic kidney disease, stage 3 unspecified: Secondary | ICD-10-CM | POA: Diagnosis not present

## 2020-12-09 DIAGNOSIS — I129 Hypertensive chronic kidney disease with stage 1 through stage 4 chronic kidney disease, or unspecified chronic kidney disease: Secondary | ICD-10-CM | POA: Diagnosis not present

## 2020-12-09 LAB — CBC
HCT: 32.3 % — ABNORMAL LOW (ref 36.0–46.0)
Hemoglobin: 10.9 g/dL — ABNORMAL LOW (ref 12.0–15.0)
MCH: 29.1 pg (ref 26.0–34.0)
MCHC: 33.7 g/dL (ref 30.0–36.0)
MCV: 86.4 fL (ref 80.0–100.0)
Platelets: 284 10*3/uL (ref 150–400)
RBC: 3.74 MIL/uL — ABNORMAL LOW (ref 3.87–5.11)
RDW: 15.1 % (ref 11.5–15.5)
WBC: 7.4 10*3/uL (ref 4.0–10.5)
nRBC: 0 % (ref 0.0–0.2)

## 2020-12-09 LAB — APTT: aPTT: 35 seconds (ref 24–36)

## 2020-12-09 LAB — URINALYSIS, COMPLETE (UACMP) WITH MICROSCOPIC
Bacteria, UA: NONE SEEN
Bilirubin Urine: NEGATIVE
Glucose, UA: NEGATIVE mg/dL
Hgb urine dipstick: NEGATIVE
Ketones, ur: NEGATIVE mg/dL
Leukocytes,Ua: NEGATIVE
Nitrite: NEGATIVE
Protein, ur: NEGATIVE mg/dL
Specific Gravity, Urine: 1.004 — ABNORMAL LOW (ref 1.005–1.030)
pH: 7 (ref 5.0–8.0)

## 2020-12-09 LAB — BASIC METABOLIC PANEL
Anion gap: 9 (ref 5–15)
BUN: 28 mg/dL — ABNORMAL HIGH (ref 8–23)
CO2: 22 mmol/L (ref 22–32)
Calcium: 9.7 mg/dL (ref 8.9–10.3)
Chloride: 107 mmol/L (ref 98–111)
Creatinine, Ser: 1.83 mg/dL — ABNORMAL HIGH (ref 0.44–1.00)
GFR, Estimated: 28 mL/min — ABNORMAL LOW (ref >60)
Glucose, Bld: 94 mg/dL (ref 70–99)
Potassium: 4 mmol/L (ref 3.5–5.1)
Sodium: 138 mmol/L (ref 135–145)

## 2020-12-09 LAB — HEPATIC FUNCTION PANEL
ALT: 16 U/L (ref 0–44)
AST: 20 U/L (ref 15–41)
Albumin: 3.9 g/dL (ref 3.5–5.0)
Alkaline Phosphatase: 60 U/L (ref 38–126)
Bilirubin, Direct: 0.1 mg/dL (ref 0.0–0.2)
Indirect Bilirubin: 0.8 mg/dL (ref 0.3–0.9)
Total Bilirubin: 0.9 mg/dL (ref 0.3–1.2)
Total Protein: 7.2 g/dL (ref 6.5–8.1)

## 2020-12-09 LAB — TROPONIN I (HIGH SENSITIVITY): Troponin I (High Sensitivity): 9 ng/L (ref <18)

## 2020-12-09 LAB — PROTIME-INR
INR: 1 (ref 0.8–1.2)
Prothrombin Time: 12.9 seconds (ref 11.4–15.2)

## 2020-12-09 NOTE — ED Notes (Signed)
Patient to CT at this time

## 2020-12-09 NOTE — ED Provider Notes (Signed)
[facility] Emergency Department Provider Note  ____________________________________________   Event Date/Time   First MD Initiated Contact with Patient 12/09/20 1619     (approximate)  I have reviewed the triage vital signs and the nursing notes.   HISTORY  Chief Complaint Dizziness    HPI Linda Ibarra is a 80 y.o. female presents emergency department with complaints of dizziness.  Patient states she was bending down playing outdoors and became dizzy when she stood back up.  Denies any headache or weakness.  No slurred speech or facial droop.  States her son became very concerned that maybe she had a stroke.    Past Medical History:  Diagnosis Date  . Anemia   . Arthritis   . H/O seasonal allergies   . Hypertension   . Wears dentures    partial upper    Patient Active Problem List   Diagnosis Date Noted  . 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 Chronic kidney disease, stage III (moderate) (HCC) 07/24/2018  . 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 Hyperlipidemia 07/15/2015  . Obesity 01/06/2014  . Bilateral dry eyes 12/30/2013  . Hypertension 12/30/2013  . 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 Primary open angle glaucoma (POAG) of both eyes 12/30/2013    Past Surgical History:  Procedure Laterality Date  . ABDOMINAL HYSTERECTOMY    . BACK SURGERY     Spinal Fusion, Dr. Gerrit Heck, [facility]  . KNEE ARTHROPLASTY Left 09/28/2015   Procedure: COMPUTER ASSISTED TOTAL KNEE ARTHROPLASTY;  Surgeon: Donato Heinz, MD;  Location: [facility] ORS;  Service: Orthopedics;  Laterality: Left;  . KNEE ARTHROPLASTY Right 02/08/2020   Procedure: COMPUTER ASSISTED TOTAL KNEE ARTHROPLASTY ;  Surgeon: Donato Heinz, MD;  Location: [facility] ORS;  Service: Orthopedics;  Laterality: Right;  . ORIF TIBIA PLATEAU Right 02/08/2020   Procedure: OPEN REDUCTION INTERNAL FIXATION (ORIF) TIBIAL PLATEAU;  Surgeon: Donato Heinz, MD;  Location: [facility]  ORS;  Service: Orthopedics;  Laterality: Right;    Prior to Admission medications   Medication Sig Start Date End Date Taking? Authorizing Provider  acetaminophen (TYLENOL) 650 MG CR tablet Take 1,300 mg by mouth in the morning and at bedtime.    [provider]  amoxicillin (AMOXIL) 500 MG tablet Take 2,000 mg by mouth as needed (Before Dental appointments).    [provider]  Calcium Citrate-Vitamin D 315-250 MG-UNIT TABS Take 1 tablet by mouth daily.    [provider]  dorzolamide-timolol (COSOPT) 22.3-6.8 MG/ML ophthalmic solution Place 1 drop into both eyes 2 (two) times daily.    [provider]  fluticasone (FLONASE) 50 MCG/ACT nasal spray Place 1 spray into both nostrils daily.  Patient not taking: Reported on 08/02/2020 11/05/19   [provider]  hydrochlorothiazide (HYDRODIURIL) 25 MG tablet Take 12.5 mg by mouth daily.    [provider]  latanoprost (XALATAN) 0.005 % ophthalmic solution Place 1 drop into both eyes at bedtime.    [provider]  losartan (COZAAR) 25 MG tablet Take 25 mg by mouth daily.  10/06/19   [provider]  Multiple Vitamins-Minerals (CENTRUM ADULTS) TABS Take 1 tablet by mouth daily.    [provider]  oxybutynin (DITROPAN) 5 MG tablet Take 5 mg by mouth daily.    [provider]  oxyCODONE (OXY IR/ROXICODONE) 5 MG immediate release tablet Take 1 tablet (5 mg total) by mouth every 4 (four) hours as needed for moderate  pain (pain score 4-6). Patient not taking: Reported on 08/02/2020 02/10/20   Madelyn Flavors, PA-C  Red Yeast Rice 600 MG TABS Take 600 mg by mouth daily.    [provider]  traMADol (ULTRAM) 50 MG tablet Take 1 tablet (50 mg total) by mouth every 4 (four) hours as needed for moderate pain. Patient not taking: Reported on 08/02/2020 02/10/20   Lasandra Beech B, PA-C    Allergies Sudafed [pseudoephedrine], Metoprolol, and Tramadol  Family  History  Problem Relation Age of Onset  . Breast cancer Other 50       bilateral breast ca    Social History Social History   Tobacco Use  . Smoking status: Never Smoker  . Smokeless tobacco: Never Used  Vaping Use  . Vaping Use: Never used  Substance Use Topics  . Alcohol use: No  . Drug use: No    Review of Systems  Constitutional: No fever/chills Eyes: No visual changes. ENT: No sore throat. Respiratory: Denies cough Cardiovascular: Denies chest pain Gastrointestinal: Denies abdominal pain Genitourinary: Negative for dysuria. Musculoskeletal: Negative for back pain. Skin: Negative for rash. Psychiatric: no mood changes,     ____________________________________________   PHYSICAL EXAM:  VITAL SIGNS: ED Triage Vitals  Enc Vitals Group     BP 12/09/20 1549 (!) 152/81     Pulse Rate 12/09/20 1554 74     Resp 12/09/20 1549 20     Temp 12/09/20 1549 98.4 F (36.9 C)     Temp Source 12/09/20 1549 Oral     SpO2 12/09/20 1549 99 %     Weight 12/09/20 1549 161 lb (73 kg)     Height 12/09/20 1549 4\' 11"  (1.499 m)     Head Circumference --      Peak Flow --      Pain Score --      Pain Loc --      Pain Edu? --      Excl. in GC? --     Constitutional: Alert and oriented. Well appearing and in no acute distress. Eyes: Conjunctivae are normal.  PERRL, EOMI, no nystagmus noted Head: Atraumatic. Nose: No congestion/rhinnorhea. Mouth/Throat: Mucous membranes are moist.   Neck:  supple no lymphadenopathy noted Cardiovascular: Normal rate, regular rhythm. Heart sounds are normal Respiratory: Normal respiratory effort.  No retractions, lungs c t a  Abd: soft nontender bs normal all 4 quad GU: deferred Musculoskeletal: FROM all extremities, warm and well perfused Neurologic:  Normal speech and language.  Skin:  Skin is warm, dry and intact. No rash noted. Psychiatric: Mood and affect are normal. Speech and behavior are  normal.  ____________________________________________   LABS (all labs ordered are listed, but only abnormal results are displayed)  Labs Reviewed  BASIC METABOLIC PANEL - Abnormal; Notable for the following components:      Result Value   BUN 28 (*)    Creatinine, Ser 1.83 (*)    GFR, Estimated 28 (*)    All other components within normal limits  CBC - Abnormal; Notable for the following components:   RBC 3.74 (*)    Hemoglobin 10.9 (*)    HCT 32.3 (*)    All other components within normal limits  URINALYSIS, COMPLETE (UACMP) WITH MICROSCOPIC - Abnormal; Notable for the following components:   Color, Urine STRAW (*)    APPearance CLEAR (*)    Specific Gravity, Urine 1.004 (*)    All other components within normal limits  HEPATIC FUNCTION PANEL  PROTIME-INR  APTT  TROPONIN I (HIGH SENSITIVITY)  TROPONIN I (HIGH SENSITIVITY)   ____________________________________________   ____________________________________________  RADIOLOGY  CT of the head  ____________________________________________   PROCEDURES  Procedure(s) performed: EKG shows sinus rhythm, see physician read   Procedures    ____________________________________________   INITIAL IMPRESSION / ASSESSMENT AND PLAN / ED COURSE  Pertinent labs & imaging results that were available during my care of the patient were reviewed by me and considered in my medical decision making (see chart for details).   The patient 80 year old female presents with dizziness.  See HPI.  Physical exam shows patient appears very stable.  DDx: Positional vertigo, dizziness, CVA, MI, dehydration  Patient's labs are very reassuring, CBC and metabolic panel are consistent with her normal trend, hepatic panel is normal, troponin is normal, PT and PTT are normal, urinalysis normal  CT of the head reviewed by me confirmed by radiology to be negative for any acute abnormality  I did explain the findings to the patient and her  son.  She is to take it easy for the next 24 hours.  If she is worsening they are to return emergency department.  Drink plenty of fluids.  Take your regular medications as prescribed.  She was discharged in stable condition.    Linda Ibarra was evaluated in Emergency Department on 12/09/2020 for the symptoms described in the history of present illness. She was evaluated in the context of the global COVID-19 pandemic, which necessitated consideration that the patient might be at risk for infection with the SARS-CoV-2 virus that causes COVID-19. Institutional protocols and algorithms that pertain to the evaluation of patients at risk for COVID-19 are in a state of rapid change based on information released by regulatory bodies including the CDC and federal and state organizations. These policies and algorithms were followed during the patient's care in the ED.    As part of my medical decision making, I reviewed the following data within the electronic MEDICAL RECORD NUMBER History obtained from family, Nursing notes reviewed and incorporated, Labs reviewed , EKG interpreted see physician., Old chart reviewed, Radiograph reviewed , Notes from prior ED visits and [place] Controlled Substance Database  ____________________________________________   FINAL CLINICAL IMPRESSION(S) / ED DIAGNOSES  Final diagnoses:  Dizziness      NEW MEDICATIONS STARTED DURING THIS VISIT:  New Prescriptions   No medications on file     Note:  This document was prepared using Dragon voice recognition software and may include unintentional dictation errors.    Faythe Ghee, PA-C 12/09/20 1817    Phineas Semen, MD 12/09/20 [phone]

## 2020-12-09 NOTE — ED Notes (Signed)
Patient ambulated around room with RN with no dizziness. Fisher, PA aware.

## 2020-12-09 NOTE — ED Triage Notes (Signed)
Pt to ER via POV with complaints of dizziness. Pt reports bending down and cleaning her drawers, getting back up and becoming dizzy. Denies weakness. Grip strength equal bilaterally, no facial droop.  Pt denies hx of vertigo.

## 2020-12-09 NOTE — Discharge Instructions (Addendum)
Follow-up with your regular doctor if not improving in 2 to 3 days.  Return emergency department worsening.  Continue take your regular medications as prescribed.  Be careful when bending over, leaning to rise back to standing position very slowly

## 2020-12-14 DIAGNOSIS — R42 Dizziness and giddiness: Secondary | ICD-10-CM | POA: Diagnosis not present

## 2020-12-14 DIAGNOSIS — M5440 Lumbago with sciatica, unspecified side: Secondary | ICD-10-CM | POA: Diagnosis not present

## 2020-12-19 DIAGNOSIS — Z23 Encounter for immunization: Secondary | ICD-10-CM | POA: Diagnosis not present

## 2020-12-23 DIAGNOSIS — Z0100 Encounter for examination of eyes and vision without abnormal findings: Secondary | ICD-10-CM | POA: Diagnosis not present

## 2020-12-23 DIAGNOSIS — H2513 Age-related nuclear cataract, bilateral: Secondary | ICD-10-CM | POA: Diagnosis not present

## 2020-12-26 ENCOUNTER — Other Ambulatory Visit: Payer: Self-pay | Admitting: Family Medicine

## 2020-12-26 DIAGNOSIS — M5136 Other intervertebral disc degeneration, lumbar region: Secondary | ICD-10-CM | POA: Diagnosis not present

## 2020-12-26 DIAGNOSIS — M4856XA Collapsed vertebra, not elsewhere classified, lumbar region, initial encounter for fracture: Secondary | ICD-10-CM | POA: Diagnosis not present

## 2020-12-26 DIAGNOSIS — M5416 Radiculopathy, lumbar region: Secondary | ICD-10-CM

## 2021-01-05 ENCOUNTER — Ambulatory Visit
Admission: RE | Admit: 2021-01-05 | Discharge: 2021-01-05 | Disposition: A | Discharge status: Home / Self Care | Payer: Medicare HMO | Source: Ambulatory Visit | Attending: Family Medicine | Admitting: Family Medicine

## 2021-01-05 ENCOUNTER — Other Ambulatory Visit: Payer: Self-pay

## 2021-01-05 DIAGNOSIS — M5416 Radiculopathy, lumbar region: Secondary | ICD-10-CM | POA: Insufficient documentation

## 2021-01-05 DIAGNOSIS — M2578 Osteophyte, vertebrae: Secondary | ICD-10-CM | POA: Diagnosis not present

## 2021-01-05 DIAGNOSIS — M5115 Intervertebral disc disorders with radiculopathy, thoracolumbar region: Secondary | ICD-10-CM | POA: Diagnosis not present

## 2021-01-05 DIAGNOSIS — M4726 Other spondylosis with radiculopathy, lumbar region: Secondary | ICD-10-CM | POA: Diagnosis not present

## 2021-01-05 DIAGNOSIS — M5116 Intervertebral disc disorders with radiculopathy, lumbar region: Secondary | ICD-10-CM | POA: Diagnosis not present

## 2021-01-06 DIAGNOSIS — M5416 Radiculopathy, lumbar region: Secondary | ICD-10-CM | POA: Diagnosis not present

## 2021-01-06 DIAGNOSIS — M5136 Other intervertebral disc degeneration, lumbar region: Secondary | ICD-10-CM | POA: Diagnosis not present

## 2021-01-17 DIAGNOSIS — U071 COVID-19: Secondary | ICD-10-CM | POA: Diagnosis not present

## 2021-01-26 DIAGNOSIS — N183 Chronic kidney disease, stage 3 unspecified: Secondary | ICD-10-CM | POA: Diagnosis not present

## 2021-01-30 DIAGNOSIS — Z6838 Body mass index (BMI) 38.0-38.9, adult: Secondary | ICD-10-CM | POA: Diagnosis not present

## 2021-01-30 DIAGNOSIS — E7800 Pure hypercholesterolemia, unspecified: Secondary | ICD-10-CM | POA: Diagnosis not present

## 2021-01-30 DIAGNOSIS — E6609 Other obesity due to excess calories: Secondary | ICD-10-CM | POA: Diagnosis not present

## 2021-01-30 DIAGNOSIS — I129 Hypertensive chronic kidney disease with stage 1 through stage 4 chronic kidney disease, or unspecified chronic kidney disease: Secondary | ICD-10-CM | POA: Diagnosis not present

## 2021-01-30 DIAGNOSIS — N1831 Chronic kidney disease, stage 3a: Secondary | ICD-10-CM | POA: Diagnosis not present

## 2021-02-02 DIAGNOSIS — H2512 Age-related nuclear cataract, left eye: Secondary | ICD-10-CM | POA: Diagnosis not present

## 2021-02-20 ENCOUNTER — Encounter: Payer: Self-pay | Admitting: Ophthalmology

## 2021-02-21 DIAGNOSIS — Z96653 Presence of artificial knee joint, bilateral: Secondary | ICD-10-CM | POA: Diagnosis not present

## 2021-02-21 DIAGNOSIS — Z96651 Presence of right artificial knee joint: Secondary | ICD-10-CM | POA: Diagnosis not present

## 2021-02-21 DIAGNOSIS — Z96652 Presence of left artificial knee joint: Secondary | ICD-10-CM | POA: Diagnosis not present

## 2021-02-23 DIAGNOSIS — M5416 Radiculopathy, lumbar region: Secondary | ICD-10-CM | POA: Diagnosis not present

## 2021-02-23 DIAGNOSIS — M48061 Spinal stenosis, lumbar region without neurogenic claudication: Secondary | ICD-10-CM | POA: Diagnosis not present

## 2021-02-23 NOTE — Discharge Instructions (Signed)

## 2021-02-28 ENCOUNTER — Encounter
Admission: RE | Disposition: A | Discharge status: Home / Self Care | Payer: Self-pay | Source: Home / Self Care | Attending: Ophthalmology

## 2021-02-28 ENCOUNTER — Encounter: Payer: Self-pay | Admitting: Ophthalmology

## 2021-02-28 ENCOUNTER — Ambulatory Visit: Payer: Medicare HMO | Admitting: Anesthesiology

## 2021-02-28 ENCOUNTER — Other Ambulatory Visit: Payer: Self-pay

## 2021-02-28 ENCOUNTER — Ambulatory Visit
Admission: RE | Admit: 2021-02-28 | Discharge: 2021-02-28 | Disposition: A | Discharge status: Home / Self Care | Payer: Medicare HMO | Attending: Ophthalmology | Admitting: Ophthalmology

## 2021-02-28 DIAGNOSIS — Z888 Allergy status to other drugs, medicaments and biological substances status: Secondary | ICD-10-CM | POA: Insufficient documentation

## 2021-02-28 DIAGNOSIS — Z803 Family history of malignant neoplasm of breast: Secondary | ICD-10-CM | POA: Insufficient documentation

## 2021-02-28 DIAGNOSIS — H2512 Age-related nuclear cataract, left eye: Secondary | ICD-10-CM | POA: Diagnosis not present

## 2021-02-28 DIAGNOSIS — Z79899 Other long term (current) drug therapy: Secondary | ICD-10-CM | POA: Insufficient documentation

## 2021-02-28 DIAGNOSIS — H25812 Combined forms of age-related cataract, left eye: Secondary | ICD-10-CM | POA: Diagnosis not present

## 2021-02-28 HISTORY — PX: CATARACT EXTRACTION W/PHACO: SHX586

## 2021-02-28 SURGERY — PHACOEMULSIFICATION, CATARACT, WITH IOL INSERTION
Anesthesia: Monitor Anesthesia Care | Site: Eye | Laterality: Left

## 2021-02-28 MED ORDER — TETRACAINE HCL 0.5 % OP SOLN
1.0000 [drp] | OPHTHALMIC | Status: DC | PRN
  Administered 2021-02-28 (×3): 1 [drp] via OPHTHALMIC

## 2021-02-28 MED ORDER — CYCLOPENTOLATE HCL 2 % OP SOLN
1.0000 [drp] | OPHTHALMIC | Status: DC | PRN
  Administered 2021-02-28 (×3): 1 [drp] via OPHTHALMIC

## 2021-02-28 MED ORDER — BRIMONIDINE TARTRATE-TIMOLOL 0.2-0.5 % OP SOLN
OPHTHALMIC | Status: DC | PRN
  Administered 2021-02-28: 1 [drp] via OPHTHALMIC

## 2021-02-28 MED ORDER — ACETAMINOPHEN 325 MG PO TABS: 325.0000 mg | ORAL_TABLET | ORAL | Status: DC | PRN

## 2021-02-28 MED ORDER — SIGHTPATH DOSE#1 BSS IO SOLN
INTRAOCULAR | Status: DC | PRN
  Administered 2021-02-28: 2 mL

## 2021-02-28 MED ORDER — PHENYLEPHRINE HCL 10 % OP SOLN
1.0000 [drp] | OPHTHALMIC | Status: DC | PRN
  Administered 2021-02-28 (×3): 1 [drp] via OPHTHALMIC

## 2021-02-28 MED ORDER — SIGHTPATH DOSE#1 NA CHONDROIT SULF-NA HYALURON 40-17 MG/ML IO SOLN
INTRAOCULAR | Status: DC | PRN
  Administered 2021-02-28: 1 mL via INTRAOCULAR

## 2021-02-28 MED ORDER — MOXIFLOXACIN HCL 0.5 % OP SOLN
OPHTHALMIC | Status: DC | PRN
  Administered 2021-02-28: 0.2 mL via OPHTHALMIC

## 2021-02-28 MED ORDER — SIGHTPATH DOSE#1 BSS IO SOLN
INTRAOCULAR | Status: DC | PRN
  Administered 2021-02-28: 15 mL via INTRAOCULAR

## 2021-02-28 MED ORDER — FENTANYL CITRATE (PF) 100 MCG/2ML IJ SOLN
INTRAMUSCULAR | Status: DC | PRN
  Administered 2021-02-28: 50 ug via INTRAVENOUS

## 2021-02-28 MED ORDER — SIGHTPATH DOSE#1 BSS IO SOLN
INTRAOCULAR | Status: DC | PRN
  Administered 2021-02-28: 76 mL via OPHTHALMIC

## 2021-02-28 MED ORDER — ACETAMINOPHEN 160 MG/5ML PO SOLN: 325.0000 mg | ORAL | Status: DC | PRN

## 2021-02-28 MED ORDER — MIDAZOLAM HCL 2 MG/2ML IJ SOLN
INTRAMUSCULAR | Status: DC | PRN
  Administered 2021-02-28: 1 mg via INTRAVENOUS

## 2021-02-28 MED ORDER — LACTATED RINGERS IV SOLN: INTRAVENOUS | Status: DC

## 2021-02-28 SURGICAL SUPPLY — 15 items
CANNULA ANT/CHMB 27GA (MISCELLANEOUS) ×4 IMPLANT
GLOVE SURG ENC TEXT LTX SZ8 (GLOVE) ×2 IMPLANT
GLOVE SURG TRIUMPH 8.0 PF LTX (GLOVE) ×2 IMPLANT
GOWN STRL REUS W/ TWL LRG LVL3 (GOWN DISPOSABLE) ×2 IMPLANT
GOWN STRL REUS W/TWL LRG LVL3 (GOWN DISPOSABLE) ×4
KIT SLEEVE INFUSION .9 MICRO (MISCELLANEOUS) IMPLANT
LENS IOL TECNIS EYHANCE 23.5 (Intraocular Lens) ×2 IMPLANT
MARKER SKIN DUAL TIP RULER LAB (MISCELLANEOUS) ×2 IMPLANT
NEEDLE FILTER BLUNT 18X 1/2SAF (NEEDLE) ×1
NEEDLE FILTER BLUNT 18X1 1/2 (NEEDLE) ×1 IMPLANT
PACK EYE AFTER SURG (MISCELLANEOUS) ×2 IMPLANT
SYR 3ML LL SCALE MARK (SYRINGE) ×2 IMPLANT
SYR TB 1ML LUER SLIP (SYRINGE) ×2 IMPLANT
WATER STERILE IRR 250ML POUR (IV SOLUTION) ×2 IMPLANT
WIPE NON LINTING 3.25X3.25 (MISCELLANEOUS) ×2 IMPLANT

## 2021-02-28 NOTE — Anesthesia Procedure Notes (Signed)
Procedure Name: MAC Date/Time: 02/28/2021 9:15 AM Performed by: Cameron Ali, CRNA Pre-anesthesia Checklist: Patient identified, Emergency Drugs available, Suction available, Timeout performed and Patient being monitored Patient Re-evaluated:Patient Re-evaluated prior to induction Oxygen Delivery Method: Nasal cannula Placement Confirmation: positive ETCO2

## 2021-02-28 NOTE — Transfer of Care (Signed)
Immediate Anesthesia Transfer of Care Note  Patient: Linda Ibarra  Procedure(s) Performed: CATARACT EXTRACTION PHACO AND INTRAOCULAR LENS PLACEMENT (IOC) LEFT 5.81 00:36.9 (Left: Eye)  Patient Location: PACU  Anesthesia Type: MAC  Level of Consciousness: awake, alert  and patient cooperative  Airway and Oxygen Therapy: Patient Spontanous Breathing and Patient connected to supplemental oxygen  Post-op Assessment: Post-op Vital signs reviewed, Patient's Cardiovascular Status Stable, Respiratory Function Stable, Patent Airway and No signs of Nausea or vomiting  Post-op Vital Signs: Reviewed and stable  Complications: No notable events documented.

## 2021-02-28 NOTE — Anesthesia Postprocedure Evaluation (Signed)
Anesthesia Post Note  Patient: Linda Ibarra  Procedure(s) Performed: CATARACT EXTRACTION PHACO AND INTRAOCULAR LENS PLACEMENT (IOC) LEFT 5.81 00:36.9 (Left: Eye)     Patient location during evaluation: PACU Anesthesia Type: MAC Level of consciousness: awake and alert Pain management: pain level controlled Vital Signs Assessment: post-procedure vital signs reviewed and stable Respiratory status: spontaneous breathing, nonlabored ventilation, respiratory function stable and patient connected to nasal cannula oxygen Cardiovascular status: stable and blood pressure returned to baseline Postop Assessment: no apparent nausea or vomiting Anesthetic complications: no   No notable events documented.  Trecia Rogers

## 2021-02-28 NOTE — H&P (Signed)
[facility]   Primary Care Physician:  Gracelyn Nurse, MD Ophthalmologist: Dr. Druscilla Brownie  Pre-Procedure History & Physical: HPI:  Linda Ibarra is a 80 y.o. female here for cataract surgery.   Past Medical History:  Diagnosis Date   Anemia    Arthritis    H/O seasonal allergies    Hypertension    Wears dentures    partial upper    Past Surgical History:  Procedure Laterality Date   ABDOMINAL HYSTERECTOMY     BACK SURGERY     Spinal Fusion, Dr. Gerrit Heck, [facility]   KNEE ARTHROPLASTY Left 09/28/2015   Procedure: COMPUTER ASSISTED TOTAL KNEE ARTHROPLASTY;  Surgeon: Donato Heinz, MD;  Location: [facility] ORS;  Service: Orthopedics;  Laterality: Left;   KNEE ARTHROPLASTY Right 02/08/2020   Procedure: COMPUTER ASSISTED TOTAL KNEE ARTHROPLASTY ;  Surgeon: Donato Heinz, MD;  Location: [facility] ORS;  Service: Orthopedics;  Laterality: Right;   ORIF TIBIA PLATEAU Right 02/08/2020   Procedure: OPEN REDUCTION INTERNAL FIXATION (ORIF) TIBIAL PLATEAU;  Surgeon: Donato Heinz, MD;  Location: [facility] ORS;  Service: Orthopedics;  Laterality: Right;    Prior to Admission medications   Medication Sig Start Date End Date Taking? Authorizing Provider  acetaminophen (TYLENOL) 650 MG CR tablet Take 1,300 mg by mouth in the morning and at bedtime.   Yes [provider]  Calcium Citrate-Vitamin D 315-250 MG-UNIT TABS Take 1 tablet by mouth daily.   Yes [provider]  dorzolamide-timolol (COSOPT) 22.3-6.8 MG/ML ophthalmic solution Place 1 drop into both eyes 2 (two) times daily.   Yes [provider]  hydrochlorothiazide (HYDRODIURIL) 25 MG tablet Take 12.5 mg by mouth daily.   Yes [provider]  latanoprost (XALATAN) 0.005 % ophthalmic solution Place 1 drop into both eyes at bedtime.   Yes [provider]  losartan (COZAAR) 25 MG tablet Take 25 mg by mouth daily.  10/06/19  Yes [provider]  Multiple Vitamins-Minerals (CENTRUM ADULTS) TABS Take 1  tablet by mouth daily.   Yes [provider]  Omega-3 Fatty Acids (FISH OIL) 1000 MG CAPS Take by mouth daily.   Yes [provider]  oxybutynin (DITROPAN) 5 MG tablet Take 5 mg by mouth daily.   Yes [provider]  Red Yeast Rice 600 MG TABS Take 600 mg by mouth daily.   Yes [provider]  amoxicillin (AMOXIL) 500 MG tablet Take 2,000 mg by mouth as needed (Before Dental appointments).    [provider]  fluticasone (FLONASE) 50 MCG/ACT nasal spray Place 1 spray into both nostrils daily.  Patient not taking: Reported on 08/02/2020 11/05/19   [provider]  oxyCODONE (OXY IR/ROXICODONE) 5 MG immediate release tablet Take 1 tablet (5 mg total) by mouth every 4 (four) hours as needed for moderate pain (pain score 4-6). Patient not taking: Reported on 08/02/2020 02/10/20   Lasandra Beech B, PA-C  traMADol (ULTRAM) 50 MG tablet Take 1 tablet (50 mg total) by mouth every 4 (four) hours as needed for moderate pain. Patient not taking: Reported on 08/02/2020 02/10/20   Lasandra Beech B, PA-C    Allergies as of 12/26/2020 - Review Complete 12/09/2020  Allergen Reaction Noted   Sudafed [pseudoephedrine] Hives 09/14/2015   Metoprolol Other (See Comments) 01/27/2020   Tramadol Nausea Only and Nausea And Vomiting 10/11/2015    Family History  Problem Relation Age of Onset   Breast cancer Other 50       bilateral breast  ca    Social History   Socioeconomic History   Marital status: Married    Spouse name: Not on file   Number of children: Not on file   Years of education: Not on file   Highest education level: Not on file  Occupational History   Not on file  Tobacco Use   Smoking status: Never   Smokeless tobacco: Never  Vaping Use   Vaping Use: Never used  Substance and Sexual Activity   Alcohol use: No   Drug use: No   Sexual activity: Not on file  Other Topics Concern   Not on file  Social History Narrative   Not on file    Social Determinants of Health   Financial Resource Strain: Not on file  Food Insecurity: Not on file  Transportation Needs: Not on file  Physical Activity: Not on file  Stress: Not on file  Social Connections: Not on file  Intimate Partner Violence: Not on file    Review of Systems: See HPI, otherwise negative ROS  Physical Exam: BP (!) 162/61   Pulse (!) 52   Temp (!) 97 F (36.1 C) (Temporal)   Ht 4\' 11"  (1.499 m)   Wt 78.9 kg   SpO2 100%   BMI 35.14 kg/m  General:   Alert, cooperative in NAD Head:  Normocephalic and atraumatic. Respiratory:  Normal work of breathing. Cardiovascular:  RRR  Impression/Plan: Linda Ibarra is here for cataract surgery.  Risks, benefits, limitations, and alternatives regarding cataract surgery have been reviewed with the patient.  Questions have been answered.  All parties agreeable.   Algernon Huxley, MD  02/28/2021, 9:08 AM

## 2021-02-28 NOTE — Op Note (Signed)
PREOPERATIVE DIAGNOSIS:  Nuclear sclerotic cataract of the left eye.   POSTOPERATIVE DIAGNOSIS:  Nuclear sclerotic cataract of the left eye.   OPERATIVE PROCEDURE:ORPROCALL@   SURGEON:  Galen Manila, MD.   ANESTHESIA:  Anesthesiologist: Baxter Flattery, MD CRNA: Maree Krabbe, CRNA  1.      Managed anesthesia care. 2.     0.11ml of Shugarcaine was instilled following the paracentesis   COMPLICATIONS:  None.   TECHNIQUE:   Stop and chop   DESCRIPTION OF PROCEDURE:  The patient was examined and consented in the preoperative holding area where the aforementioned topical anesthesia was applied to the left eye and then brought back to the Operating Room where the left eye was prepped and draped in the usual sterile ophthalmic fashion and a lid speculum was placed. A paracentesis was created with the side port blade and the anterior chamber was filled with viscoelastic. A near clear corneal incision was performed with the steel keratome. A continuous curvilinear capsulorrhexis was performed with a cystotome followed by the capsulorrhexis forceps. Hydrodissection and hydrodelineation were carried out with BSS on a blunt cannula. The lens was removed in a stop and chop  technique and the remaining cortical material was removed with the irrigation-aspiration handpiece. The capsular bag was inflated with viscoelastic and the Technis [id number] lens was placed in the capsular bag without complication. The remaining viscoelastic was removed from the eye with the irrigation-aspiration handpiece. The wounds were hydrated. The anterior chamber was flushed with BSS and the eye was inflated to physiologic pressure. 0.84ml Vigamox was placed in the anterior chamber. The wounds were found to be water tight. The eye was dressed with Combigan. The patient was given protective glasses to wear throughout the day and a shield with which to sleep tonight. The patient was also given drops with which to begin a drop regimen today  and will follow-up with me in one day. Implant Name Type Inv. Item Serial No. Manufacturer Lot No. LRB No. Used Action  LENS IOL TECNIS EYHANCE 23.5 - [id number] Intraocular Lens LENS IOL TECNIS EYHANCE 23.5 [id number] JOHNSON   Left 1 Implanted    Procedure(s) with comments: CATARACT EXTRACTION PHACO AND INTRAOCULAR LENS PLACEMENT (IOC) LEFT 5.81 00:36.9 (Left) - wants as early as possible  Electronically signedGalen Manila 02/28/2021 9:34 AM

## 2021-02-28 NOTE — Anesthesia Preprocedure Evaluation (Signed)
Anesthesia Evaluation  Patient identified by MRN, date of birth, ID band Patient awake    Reviewed: Allergy & Precautions, H&P , NPO status , Patient's Chart, lab work & pertinent test results, reviewed documented beta blocker date and time   Airway Mallampati: II  TM Distance: >3 FB Neck ROM: full    Dental no notable dental hx.    Pulmonary neg pulmonary ROS,    Pulmonary exam normal breath sounds clear to auscultation       Cardiovascular Exercise Tolerance: Good hypertension, Normal cardiovascular exam Rhythm:regular Rate:Normal     Neuro/Psych negative neurological ROS  negative psych ROS   GI/Hepatic negative GI ROS, Neg liver ROS,   Endo/Other  negative endocrine ROS  Renal/GU negative Renal ROS  negative genitourinary   Musculoskeletal   Abdominal   Peds  Hematology negative hematology ROS (+)   Anesthesia Other Findings   Reproductive/Obstetrics negative OB ROS                             Anesthesia Physical Anesthesia Plan  ASA: 2  Anesthesia Plan: MAC   Post-op Pain Management:    Induction:   PONV Risk Score and Plan:   Airway Management Planned:   Additional Equipment:   Intra-op Plan:   Post-operative Plan:   Informed Consent: I have reviewed the patients History and Physical, chart, labs and discussed the procedure including the risks, benefits and alternatives for the proposed anesthesia with the patient or authorized representative who has indicated his/her understanding and acceptance.     Dental Advisory Given  Plan Discussed with: CRNA and Anesthesiologist  Anesthesia Plan Comments:         Anesthesia Quick Evaluation

## 2021-03-01 ENCOUNTER — Encounter: Payer: Self-pay | Admitting: Ophthalmology

## 2021-03-06 DIAGNOSIS — H2511 Age-related nuclear cataract, right eye: Secondary | ICD-10-CM | POA: Diagnosis not present

## 2021-03-14 ENCOUNTER — Encounter: Payer: Self-pay | Admitting: Ophthalmology

## 2021-03-14 ENCOUNTER — Ambulatory Visit
Admission: RE | Admit: 2021-03-14 | Discharge: 2021-03-14 | Disposition: A | Discharge status: Home / Self Care | Payer: Medicare HMO | Attending: Ophthalmology | Admitting: Ophthalmology

## 2021-03-14 ENCOUNTER — Ambulatory Visit: Payer: Medicare HMO | Admitting: Anesthesiology

## 2021-03-14 ENCOUNTER — Encounter
Admission: RE | Disposition: A | Discharge status: Home / Self Care | Payer: Self-pay | Source: Home / Self Care | Attending: Ophthalmology

## 2021-03-14 ENCOUNTER — Other Ambulatory Visit: Payer: Self-pay

## 2021-03-14 DIAGNOSIS — Z90710 Acquired absence of both cervix and uterus: Secondary | ICD-10-CM | POA: Diagnosis not present

## 2021-03-14 DIAGNOSIS — Z888 Allergy status to other drugs, medicaments and biological substances status: Secondary | ICD-10-CM | POA: Diagnosis not present

## 2021-03-14 DIAGNOSIS — I10 Essential (primary) hypertension: Secondary | ICD-10-CM | POA: Diagnosis not present

## 2021-03-14 DIAGNOSIS — Z79899 Other long term (current) drug therapy: Secondary | ICD-10-CM | POA: Insufficient documentation

## 2021-03-14 DIAGNOSIS — H2511 Age-related nuclear cataract, right eye: Secondary | ICD-10-CM | POA: Diagnosis not present

## 2021-03-14 DIAGNOSIS — H25811 Combined forms of age-related cataract, right eye: Secondary | ICD-10-CM | POA: Diagnosis not present

## 2021-03-14 HISTORY — PX: CATARACT EXTRACTION W/PHACO: SHX586

## 2021-03-14 SURGERY — PHACOEMULSIFICATION, CATARACT, WITH IOL INSERTION
Anesthesia: Monitor Anesthesia Care | Site: Eye | Laterality: Right

## 2021-03-14 MED ORDER — CYCLOPENTOLATE HCL 2 % OP SOLN
1.0000 [drp] | OPHTHALMIC | Status: AC
  Administered 2021-03-14 (×3): 1 [drp] via OPHTHALMIC

## 2021-03-14 MED ORDER — SIGHTPATH DOSE#1 NA CHONDROIT SULF-NA HYALURON 40-17 MG/ML IO SOLN
INTRAOCULAR | Status: DC | PRN
  Administered 2021-03-14: 1 mL via INTRAOCULAR

## 2021-03-14 MED ORDER — MIDAZOLAM HCL 2 MG/2ML IJ SOLN
INTRAMUSCULAR | Status: DC | PRN
  Administered 2021-03-14: .5 mg via INTRAVENOUS

## 2021-03-14 MED ORDER — BRIMONIDINE TARTRATE-TIMOLOL 0.2-0.5 % OP SOLN
OPHTHALMIC | Status: DC | PRN
  Administered 2021-03-14: 1 [drp] via OPHTHALMIC

## 2021-03-14 MED ORDER — MOXIFLOXACIN HCL 0.5 % OP SOLN
OPHTHALMIC | Status: DC | PRN
  Administered 2021-03-14: 0.2 mL via OPHTHALMIC

## 2021-03-14 MED ORDER — LACTATED RINGERS IV SOLN: INTRAVENOUS | Status: DC

## 2021-03-14 MED ORDER — SIGHTPATH DOSE#1 BSS IO SOLN
INTRAOCULAR | Status: DC | PRN
  Administered 2021-03-14: 2 mL

## 2021-03-14 MED ORDER — OXYCODONE HCL 5 MG PO TABS
5.0000 mg | ORAL_TABLET | Freq: Once | ORAL | Status: DC | PRN
Start: 2021-03-14 — End: 2021-03-14

## 2021-03-14 MED ORDER — OXYCODONE HCL 5 MG/5ML PO SOLN: 5.0000 mg | Freq: Once | ORAL | Status: DC | PRN

## 2021-03-14 MED ORDER — FENTANYL CITRATE (PF) 100 MCG/2ML IJ SOLN
INTRAMUSCULAR | Status: DC | PRN
  Administered 2021-03-14: 50 ug via INTRAVENOUS

## 2021-03-14 MED ORDER — SIGHTPATH DOSE#1 BSS IO SOLN
INTRAOCULAR | Status: DC | PRN
  Administered 2021-03-14: 15 mL via INTRAOCULAR

## 2021-03-14 MED ORDER — TETRACAINE HCL 0.5 % OP SOLN
1.0000 [drp] | OPHTHALMIC | Status: DC | PRN
  Administered 2021-03-14 (×3): 1 [drp] via OPHTHALMIC

## 2021-03-14 MED ORDER — PHENYLEPHRINE HCL 10 % OP SOLN
1.0000 [drp] | OPHTHALMIC | Status: AC
  Administered 2021-03-14 (×3): 1 [drp] via OPHTHALMIC

## 2021-03-14 MED ORDER — SIGHTPATH DOSE#1 BSS IO SOLN
INTRAOCULAR | Status: DC | PRN
  Administered 2021-03-14: 53 mL via OPHTHALMIC

## 2021-03-14 SURGICAL SUPPLY — 14 items
CANNULA ANT/CHMB 27GA (MISCELLANEOUS) ×2 IMPLANT
GLOVE SURG ENC TEXT LTX SZ8 (GLOVE) ×2 IMPLANT
GLOVE SURG TRIUMPH 8.0 PF LTX (GLOVE) ×2 IMPLANT
GOWN STRL REUS W/ TWL LRG LVL3 (GOWN DISPOSABLE) ×2 IMPLANT
GOWN STRL REUS W/TWL LRG LVL3 (GOWN DISPOSABLE) ×4
LENS IOL TECNIS EYHANCE 25.5 (Intraocular Lens) ×2 IMPLANT
MARKER SKIN DUAL TIP RULER LAB (MISCELLANEOUS) ×2 IMPLANT
NEEDLE FILTER BLUNT 18X 1/2SAF (NEEDLE) ×1
NEEDLE FILTER BLUNT 18X1 1/2 (NEEDLE) ×1 IMPLANT
PACK EYE AFTER SURG (MISCELLANEOUS) ×2 IMPLANT
SYR 3ML LL SCALE MARK (SYRINGE) ×2 IMPLANT
SYR TB 1ML LUER SLIP (SYRINGE) ×2 IMPLANT
WATER STERILE IRR 250ML POUR (IV SOLUTION) ×2 IMPLANT
WIPE NON LINTING 3.25X3.25 (MISCELLANEOUS) ×2 IMPLANT

## 2021-03-14 NOTE — Anesthesia Postprocedure Evaluation (Signed)
Anesthesia Post Note  Patient: Linda Ibarra  Procedure(s) Performed: CATARACT EXTRACTION PHACO AND INTRAOCULAR LENS PLACEMENT (IOC) RIGHT 6.60 00:45.5 (Right: Eye)     Patient location during evaluation: PACU Anesthesia Type: MAC Level of consciousness: awake and alert Pain management: pain level controlled Vital Signs Assessment: post-procedure vital signs reviewed and stable Respiratory status: spontaneous breathing Cardiovascular status: stable Anesthetic complications: no   No notable events documented.  Gillian Scarce

## 2021-03-14 NOTE — H&P (Signed)
[facility]   Primary Care Physician:  Gracelyn Nurse, MD Ophthalmologist: Dr. Willey Blade  Pre-Procedure History & Physical: HPI:  Linda Ibarra is a 80 y.o. female here for cataract surgery.   Past Medical History:  Diagnosis Date   Anemia    Arthritis    H/O seasonal allergies    Hypertension    Wears dentures    partial upper    Past Surgical History:  Procedure Laterality Date   ABDOMINAL HYSTERECTOMY     BACK SURGERY     Spinal Fusion, Dr. Gerrit Heck, [facility]   CATARACT EXTRACTION W/PHACO Left 02/28/2021   Procedure: CATARACT EXTRACTION PHACO AND INTRAOCULAR LENS PLACEMENT (IOC) LEFT 5.81 00:36.9;  Surgeon: Galen Manila, MD;  Location: [facility];  Service: Ophthalmology;  Laterality: Left;  wants as early as possible   KNEE ARTHROPLASTY Left 09/28/2015   Procedure: COMPUTER ASSISTED TOTAL KNEE ARTHROPLASTY;  Surgeon: Donato Heinz, MD;  Location: [facility];  Service: Orthopedics;  Laterality: Left;   KNEE ARTHROPLASTY Right 02/08/2020   Procedure: COMPUTER ASSISTED TOTAL KNEE ARTHROPLASTY ;  Surgeon: Donato Heinz, MD;  Location: [facility];  Service: Orthopedics;  Laterality: Right;   ORIF TIBIA PLATEAU Right 02/08/2020   Procedure: OPEN REDUCTION INTERNAL FIXATION (ORIF) TIBIAL PLATEAU;  Surgeon: Donato Heinz, MD;  Location: [facility];  Service: Orthopedics;  Laterality: Right;    Prior to Admission medications   Medication Sig Start Date End Date Taking? Authorizing Provider  acetaminophen (TYLENOL) 650 MG CR tablet Take 1,300 mg by mouth in the morning and at bedtime.   Yes [provider]  Calcium Citrate-Vitamin D 315-250 MG-UNIT TABS Take 1 tablet by mouth daily.   Yes [provider]  dorzolamide-timolol (COSOPT) 22.3-6.8 MG/ML ophthalmic solution Place 1 drop into both eyes 2 (two) times daily.   Yes [provider]  hydrochlorothiazide (HYDRODIURIL) 25 MG tablet Take 12.5 mg by mouth daily.   Yes [provider]  latanoprost (XALATAN) 0.005 % ophthalmic solution Place 1 drop into both eyes at bedtime.   Yes [provider]  losartan (COZAAR) 25 MG tablet Take 25 mg by mouth daily.  10/06/19  Yes [provider]  Multiple Vitamins-Minerals (CENTRUM ADULTS) TABS Take 1 tablet by mouth daily.   Yes [provider]  Omega-3 Fatty Acids (FISH OIL) 1000 MG CAPS Take by mouth daily.   Yes [provider]  oxybutynin (DITROPAN) 5 MG tablet Take 5 mg by mouth daily.   Yes [provider]  Red Yeast Rice 600 MG TABS Take 600 mg by mouth daily.   Yes [provider]  amoxicillin (AMOXIL) 500 MG tablet Take 2,000 mg by mouth as needed (Before Dental appointments).    [provider]  fluticasone (FLONASE) 50 MCG/ACT nasal spray Place 1 spray into both nostrils daily.  Patient not taking: Reported on 03/14/2021 11/05/19   [provider]  oxyCODONE (OXY IR/ROXICODONE) 5 MG immediate release tablet Take 1 tablet (5 mg total) by mouth every 4 (four) hours as needed for moderate pain (pain score 4-6). Patient not taking: Reported on 08/02/2020 02/10/20   Lasandra Beech B, PA-C  traMADol (ULTRAM) 50 MG tablet Take 1 tablet (50 mg total) by mouth every 4 (four) hours as needed for moderate pain. Patient not taking: Reported on 08/02/2020 02/10/20   Lasandra Beech B, PA-C    Allergies as of 12/26/2020 - Review Complete 12/09/2020  Allergen Reaction Noted   Sudafed [pseudoephedrine]  Hives 09/14/2015   Metoprolol Other (See Comments) 01/27/2020   Tramadol Nausea Only and Nausea And Vomiting 10/11/2015    Family History  Problem Relation Age of Onset   Breast cancer Other 50       bilateral breast ca    Social History   Socioeconomic History   Marital status: Married    Spouse name: Not on file   Number of children: Not on file   Years of education: Not on file   Highest education level: Not on file  Occupational History   Not on file   Tobacco Use   Smoking status: Never   Smokeless tobacco: Never  Vaping Use   Vaping Use: Never used  Substance and Sexual Activity   Alcohol use: No   Drug use: No   Sexual activity: Not on file  Other Topics Concern   Not on file  Social History Narrative   Not on file   Social Determinants of Health   Financial Resource Strain: Not on file  Food Insecurity: Not on file  Transportation Needs: Not on file  Physical Activity: Not on file  Stress: Not on file  Social Connections: Not on file  Intimate Partner Violence: Not on file    Review of Systems: See HPI, otherwise negative ROS  Physical Exam: BP (!) 172/74   Pulse (!) 51   Temp (!) 97.2 F (36.2 C) (Temporal)   Resp 16   Ht 4\' 11"  (1.499 m)   Wt 78 kg   SpO2 98%   BMI 34.74 kg/m  General:   Alert, cooperative in NAD Head:  Normocephalic and atraumatic. Respiratory:  Normal work of breathing. Cardiovascular:  RRR  Impression/Plan: Linda Ibarra is here for cataract surgery.  Risks, benefits, limitations, and alternatives regarding cataract surgery have been reviewed with the patient.  Questions have been answered.  All parties agreeable.   Algernon Huxley, MD  03/14/2021, 9:41 AM

## 2021-03-14 NOTE — Transfer of Care (Signed)
Immediate Anesthesia Transfer of Care Note  Patient: Linda Ibarra  Procedure(s) Performed: CATARACT EXTRACTION PHACO AND INTRAOCULAR LENS PLACEMENT (IOC) RIGHT 6.60 00:45.5 (Right: Eye)  Patient Location: PACU  Anesthesia Type: MAC  Level of Consciousness: awake, alert  and patient cooperative  Airway and Oxygen Therapy: Patient Spontanous Breathing and Patient connected to supplemental oxygen  Post-op Assessment: Post-op Vital signs reviewed, Patient's Cardiovascular Status Stable, Respiratory Function Stable, Patent Airway and No signs of Nausea or vomiting  Post-op Vital Signs: Reviewed and stable  Complications: No notable events documented.

## 2021-03-14 NOTE — Discharge Instructions (Signed)

## 2021-03-14 NOTE — Anesthesia Preprocedure Evaluation (Addendum)
Anesthesia Evaluation  Patient identified by MRN, date of birth, ID band Patient awake    Reviewed: Allergy & Precautions, H&P , NPO status , Patient's Chart, lab work & pertinent test results  Airway Mallampati: II  TM Distance: >3 FB Neck ROM: full    Dental  (+) Edentulous Upper   Pulmonary neg pulmonary ROS,    Pulmonary exam normal        Cardiovascular hypertension, On Medications Normal cardiovascular exam Rhythm:regular Rate:Normal     Neuro/Psych negative neurological ROS     GI/Hepatic negative GI ROS, Neg liver ROS,   Endo/Other  negative endocrine ROS  Renal/GU negative Renal ROS  negative genitourinary   Musculoskeletal   Abdominal   Peds  Hematology  (+) Blood dyscrasia, anemia ,   Anesthesia Other Findings   Reproductive/Obstetrics                            Anesthesia Physical Anesthesia Plan  ASA: 2  Anesthesia Plan: MAC   Post-op Pain Management:    Induction:   PONV Risk Score and Plan: 2 and Midazolam and Treatment may vary due to age or medical condition  Airway Management Planned:   Additional Equipment:   Intra-op Plan:   Post-operative Plan:   Informed Consent: I have reviewed the patients History and Physical, chart, labs and discussed the procedure including the risks, benefits and alternatives for the proposed anesthesia with the patient or authorized representative who has indicated his/her understanding and acceptance.       Plan Discussed with:   Anesthesia Plan Comments:         Anesthesia Quick Evaluation

## 2021-03-14 NOTE — Op Note (Signed)
PREOPERATIVE DIAGNOSIS:  Nuclear sclerotic cataract of the right eye.   POSTOPERATIVE DIAGNOSIS:  Cataract   OPERATIVE PROCEDURE:ORPROCALL@   SURGEON:  Galen Manila, MD.   ANESTHESIA:  Anesthesiologist: Jolayne Panther, MD CRNA: Lily Kocher, CRNA  1.      Managed anesthesia care. 2.      0.72ml of Shugarcaine was instilled in the eye following the paracentesis.   COMPLICATIONS:  None.   TECHNIQUE:   Stop and chop   DESCRIPTION OF PROCEDURE:  The patient was examined and consented in the preoperative holding area where the aforementioned topical anesthesia was applied to the right eye and then brought back to the Operating Room where the right eye was prepped and draped in the usual sterile ophthalmic fashion and a lid speculum was placed. A paracentesis was created with the side port blade and the anterior chamber was filled with viscoelastic. A near clear corneal incision was performed with the steel keratome. A continuous curvilinear capsulorrhexis was performed with a cystotome followed by the capsulorrhexis forceps. Hydrodissection and hydrodelineation were carried out with BSS on a blunt cannula. The lens was removed in a stop and chop  technique and the remaining cortical material was removed with the irrigation-aspiration handpiece. The capsular bag was inflated with viscoelastic and the Technis [id number]  lens was placed in the capsular bag without complication. The remaining viscoelastic was removed from the eye with the irrigation-aspiration handpiece. The wounds were hydrated. The anterior chamber was flushed with BSS and the eye was inflated to physiologic pressure. 0.58ml of Vigamox was placed in the anterior chamber. The wounds were found to be water tight. The eye was dressed with Combigan. The patient was given protective glasses to wear throughout the day and a shield with which to sleep tonight. The patient was also given drops with which to begin a drop regimen today and will  follow-up with me in one day. Implant Name Type Inv. Item Serial No. Manufacturer Lot No. LRB No. Used Action  LENS IOL TECNIS EYHANCE 25.5 - [id number] Intraocular Lens LENS IOL TECNIS EYHANCE 25.5 [id number] JOHNSON   Right 1 Implanted   Procedure(s): CATARACT EXTRACTION PHACO AND INTRAOCULAR LENS PLACEMENT (IOC) RIGHT 6.60 00:45.5 (Right)  Electronically signed: Galen Manila 03/14/2021 10:04 AM

## 2021-03-14 NOTE — Anesthesia Procedure Notes (Signed)
Procedure Name: MAC Date/Time: 03/14/2021 9:47 AM Performed by: Dionne Bucy, CRNA Pre-anesthesia Checklist: Patient identified, Emergency Drugs available, Suction available, Patient being monitored and Timeout performed Patient Re-evaluated:Patient Re-evaluated prior to induction Oxygen Delivery Method: Nasal cannula Placement Confirmation: positive ETCO2

## 2021-03-15 ENCOUNTER — Encounter: Payer: Self-pay | Admitting: Ophthalmology

## 2021-03-19 IMAGING — MG MM DIGITAL SCREENING BILAT W/ TOMO AND CAD
6 of 10 series · 6 of 30 positions shown · non-contrast
Comparison: Previous exam(s).

CLINICAL DATA: Screening.

EXAM:
DIGITAL SCREENING BILATERAL MAMMOGRAM WITH TOMOSYNTHESIS AND CAD
TECHNIQUE: Bilateral screening digital craniocaudal and mediolateral oblique
mammograms were obtained. Bilateral screening digital breast
tomosynthesis was performed. The images were evaluated with
computer-aided detection.

[R MLO synth-2D]
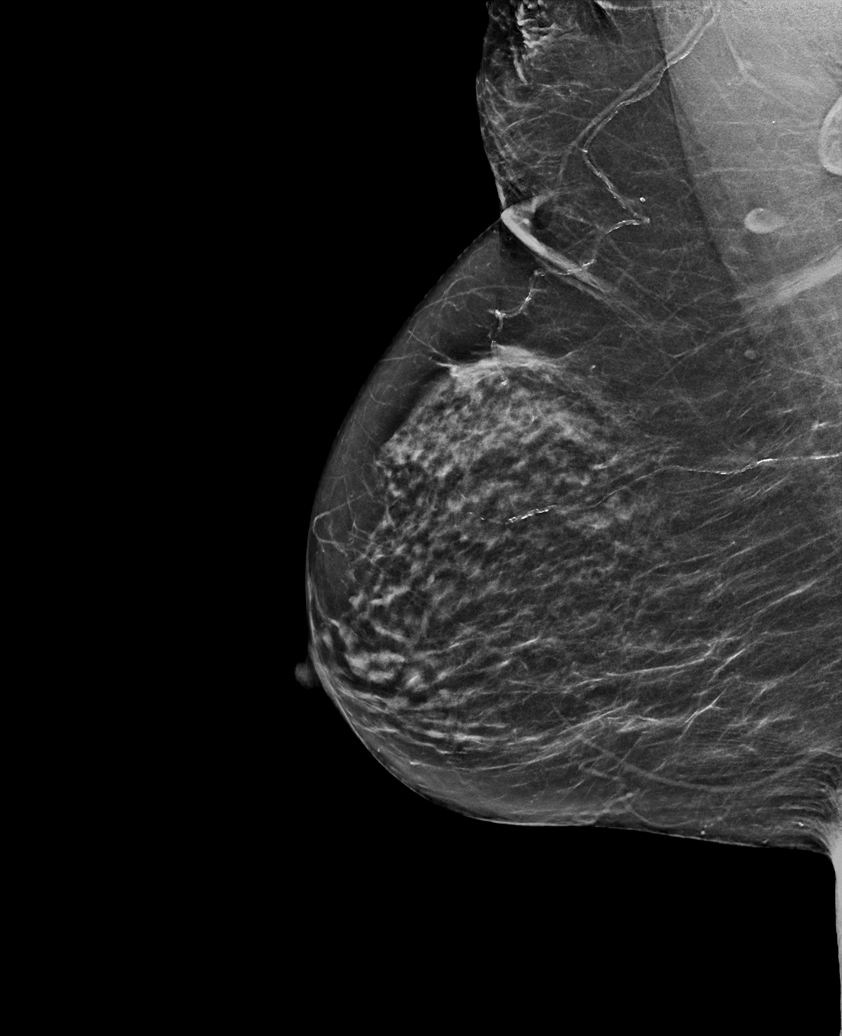

[L MLO synth-2D (1 of 2)]
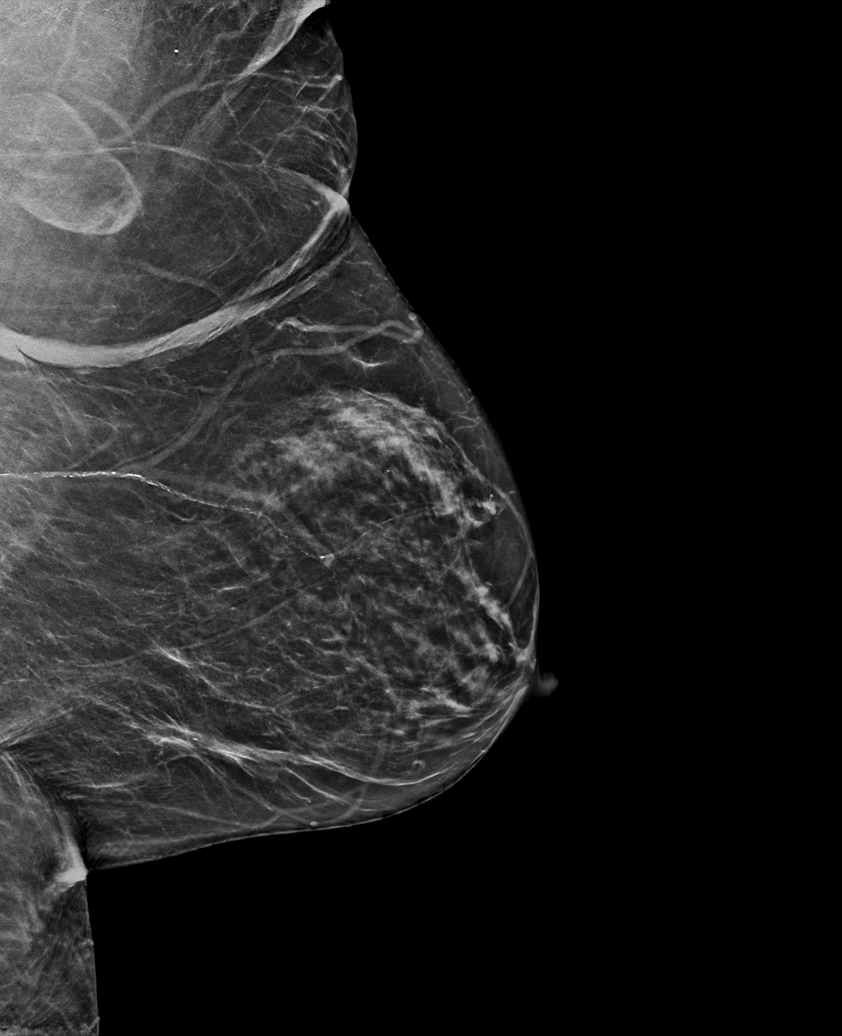

[L CC synth-2D]
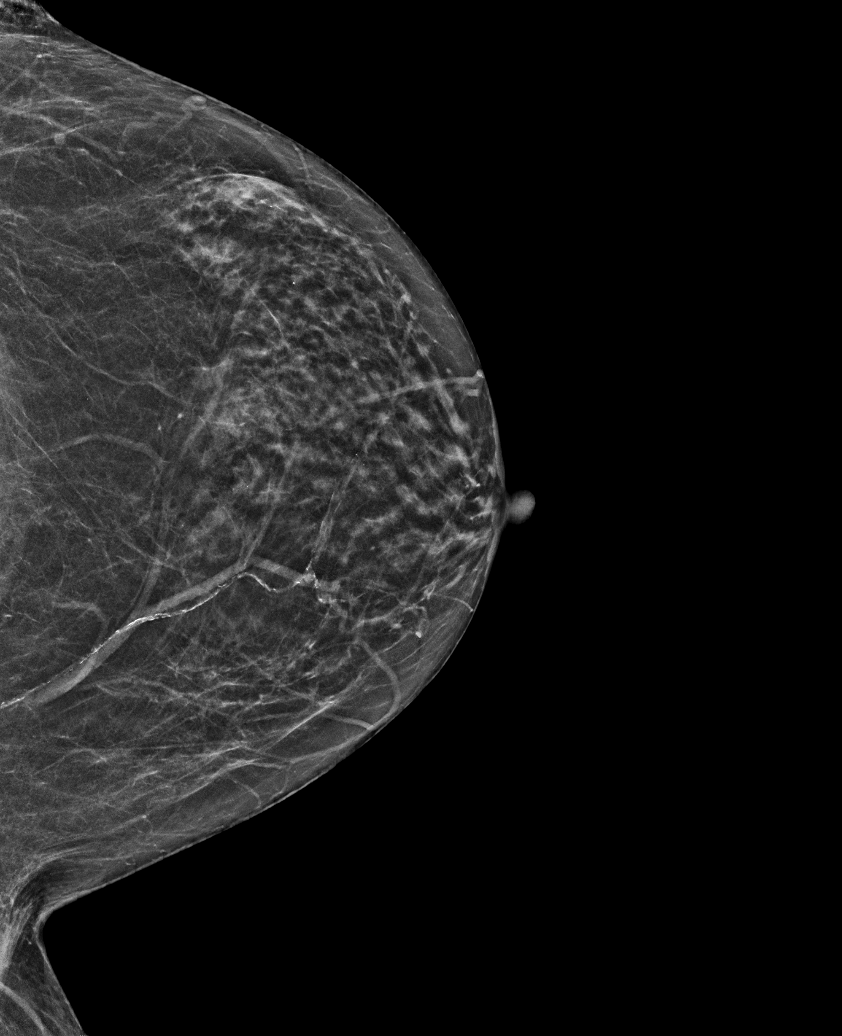

[L MLO synth-2D (2 of 2)]
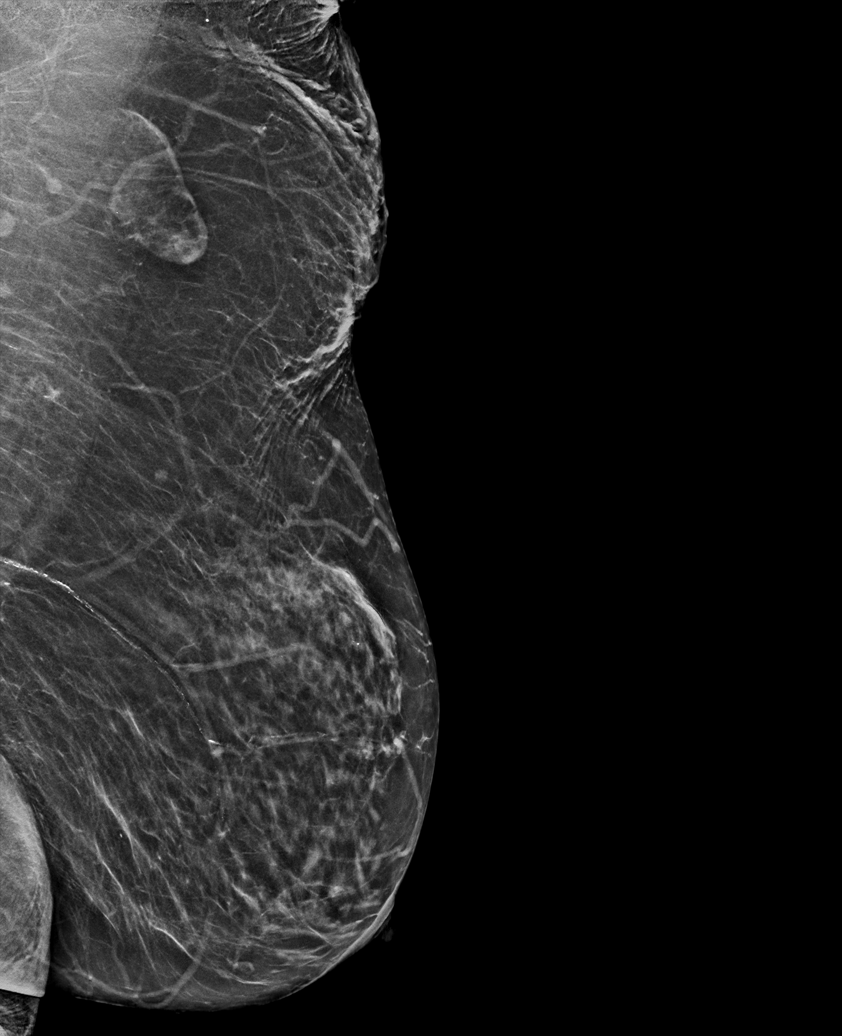

[R CC synth-2D]
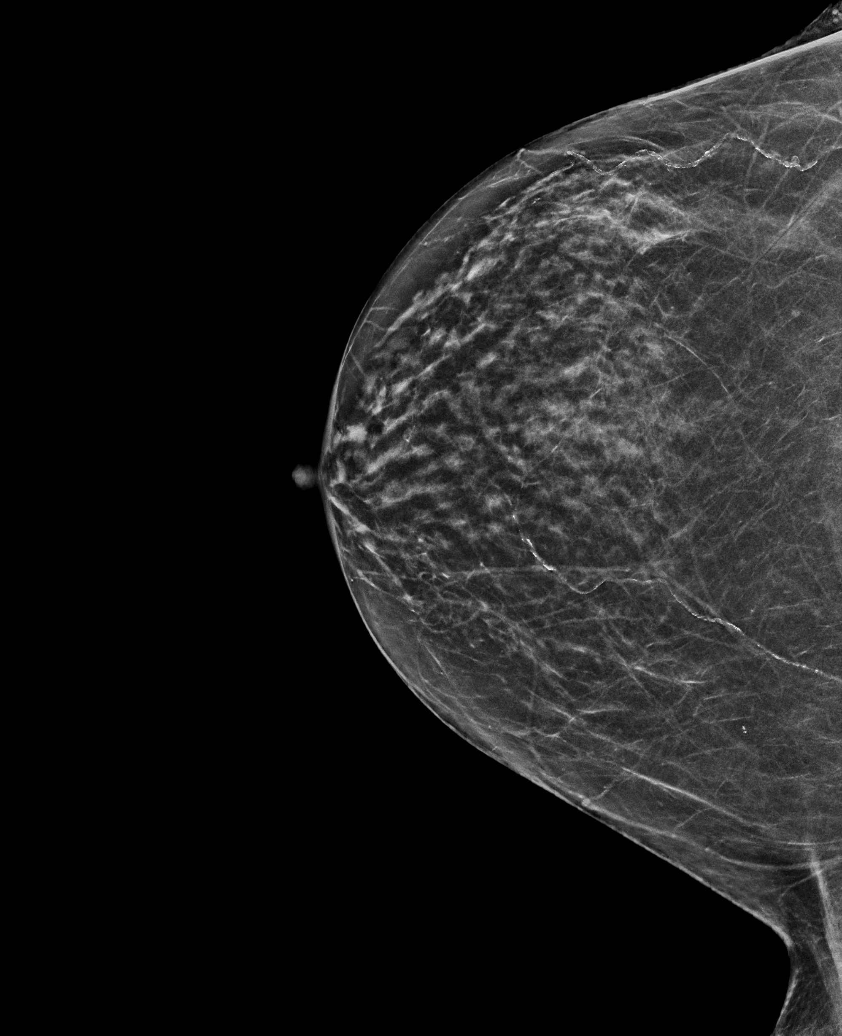

[R MLO tomo · tomo slice 29/58.0]
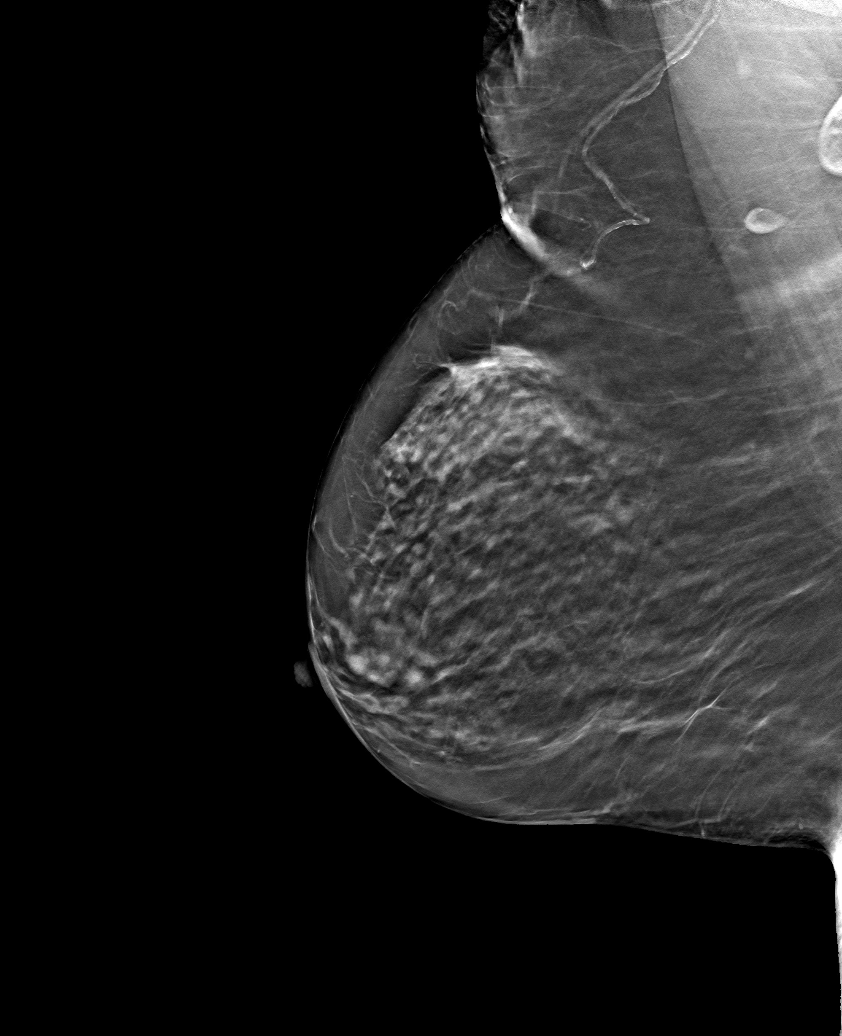

[6 of 30 positions shown; findings below may reference images not displayed]

ACR Breast Density Category b: There are scattered areas of
fibroglandular density.
FINDINGS: There are no findings suspicious for malignancy. The images were
evaluated with computer-aided detection.
IMPRESSION: No mammographic evidence of malignancy. A result letter of this
screening mammogram will be mailed directly to the patient.

RECOMMENDATION:
Screening mammogram in one year. (Code:[id number])

BI-RADS CATEGORY  1: Negative.

## 2021-03-24 DIAGNOSIS — M48061 Spinal stenosis, lumbar region without neurogenic claudication: Secondary | ICD-10-CM | POA: Diagnosis not present

## 2021-03-24 DIAGNOSIS — M5136 Other intervertebral disc degeneration, lumbar region: Secondary | ICD-10-CM | POA: Diagnosis not present

## 2021-03-24 DIAGNOSIS — M5416 Radiculopathy, lumbar region: Secondary | ICD-10-CM | POA: Diagnosis not present

## 2021-03-29 ENCOUNTER — Other Ambulatory Visit: Payer: Self-pay | Admitting: Internal Medicine

## 2021-03-29 DIAGNOSIS — Z1231 Encounter for screening mammogram for malignant neoplasm of breast: Secondary | ICD-10-CM

## 2021-04-24 DIAGNOSIS — H18892 Other specified disorders of cornea, left eye: Secondary | ICD-10-CM | POA: Diagnosis not present

## 2021-07-26 DIAGNOSIS — E7800 Pure hypercholesterolemia, unspecified: Secondary | ICD-10-CM | POA: Diagnosis not present

## 2021-07-26 DIAGNOSIS — N183 Chronic kidney disease, stage 3 unspecified: Secondary | ICD-10-CM | POA: Diagnosis not present

## 2021-08-02 DIAGNOSIS — Z1389 Encounter for screening for other disorder: Secondary | ICD-10-CM | POA: Diagnosis not present

## 2021-08-02 DIAGNOSIS — E7800 Pure hypercholesterolemia, unspecified: Secondary | ICD-10-CM | POA: Diagnosis not present

## 2021-08-02 DIAGNOSIS — Z23 Encounter for immunization: Secondary | ICD-10-CM | POA: Diagnosis not present

## 2021-08-02 DIAGNOSIS — I129 Hypertensive chronic kidney disease with stage 1 through stage 4 chronic kidney disease, or unspecified chronic kidney disease: Secondary | ICD-10-CM | POA: Diagnosis not present

## 2021-08-02 DIAGNOSIS — N183 Chronic kidney disease, stage 3 unspecified: Secondary | ICD-10-CM | POA: Diagnosis not present

## 2021-08-02 DIAGNOSIS — Z0001 Encounter for general adult medical examination with abnormal findings: Secondary | ICD-10-CM | POA: Diagnosis not present

## 2021-08-02 DIAGNOSIS — Z0000 Encounter for general adult medical examination without abnormal findings: Secondary | ICD-10-CM | POA: Diagnosis not present

## 2021-09-15 ENCOUNTER — Other Ambulatory Visit: Payer: Self-pay

## 2021-09-15 ENCOUNTER — Ambulatory Visit
Admission: RE | Admit: 2021-09-15 | Discharge: 2021-09-15 | Disposition: A | Discharge status: Home / Self Care | Payer: Medicare HMO | Source: Ambulatory Visit | Attending: Internal Medicine | Admitting: Internal Medicine

## 2021-09-15 DIAGNOSIS — Z1231 Encounter for screening mammogram for malignant neoplasm of breast: Secondary | ICD-10-CM

## 2021-10-23 DIAGNOSIS — H40003 Preglaucoma, unspecified, bilateral: Secondary | ICD-10-CM | POA: Diagnosis not present

## 2021-11-03 DIAGNOSIS — M50123 Cervical disc disorder at C6-C7 level with radiculopathy: Secondary | ICD-10-CM | POA: Diagnosis not present

## 2021-11-03 DIAGNOSIS — M542 Cervicalgia: Secondary | ICD-10-CM | POA: Diagnosis not present

## 2021-11-03 DIAGNOSIS — M5412 Radiculopathy, cervical region: Secondary | ICD-10-CM | POA: Diagnosis not present

## 2021-11-03 DIAGNOSIS — M50122 Cervical disc disorder at C5-C6 level with radiculopathy: Secondary | ICD-10-CM | POA: Diagnosis not present

## 2021-11-03 DIAGNOSIS — M50121 Cervical disc disorder at C4-C5 level with radiculopathy: Secondary | ICD-10-CM | POA: Diagnosis not present

## 2021-11-03 DIAGNOSIS — M5030 Other cervical disc degeneration, unspecified cervical region: Secondary | ICD-10-CM | POA: Diagnosis not present

## 2022-01-23 DIAGNOSIS — I10 Essential (primary) hypertension: Secondary | ICD-10-CM | POA: Diagnosis not present

## 2022-01-30 DIAGNOSIS — N183 Chronic kidney disease, stage 3 unspecified: Secondary | ICD-10-CM | POA: Diagnosis not present

## 2022-01-30 DIAGNOSIS — E7800 Pure hypercholesterolemia, unspecified: Secondary | ICD-10-CM | POA: Diagnosis not present

## 2022-01-30 DIAGNOSIS — E6609 Other obesity due to excess calories: Secondary | ICD-10-CM | POA: Diagnosis not present

## 2022-01-30 DIAGNOSIS — I129 Hypertensive chronic kidney disease with stage 1 through stage 4 chronic kidney disease, or unspecified chronic kidney disease: Secondary | ICD-10-CM | POA: Diagnosis not present

## 2022-01-30 DIAGNOSIS — Z6835 Body mass index (BMI) 35.0-35.9, adult: Secondary | ICD-10-CM | POA: Diagnosis not present

## 2022-02-07 DIAGNOSIS — H40003 Preglaucoma, unspecified, bilateral: Secondary | ICD-10-CM | POA: Diagnosis not present

## 2022-02-20 DIAGNOSIS — Z96653 Presence of artificial knee joint, bilateral: Secondary | ICD-10-CM | POA: Diagnosis not present

## 2022-02-20 DIAGNOSIS — Z96652 Presence of left artificial knee joint: Secondary | ICD-10-CM | POA: Diagnosis not present

## 2022-02-20 DIAGNOSIS — Z96651 Presence of right artificial knee joint: Secondary | ICD-10-CM | POA: Diagnosis not present

## 2022-05-15 DIAGNOSIS — Z0100 Encounter for examination of eyes and vision without abnormal findings: Secondary | ICD-10-CM | POA: Diagnosis not present

## 2022-05-15 DIAGNOSIS — H40003 Preglaucoma, unspecified, bilateral: Secondary | ICD-10-CM | POA: Diagnosis not present

## 2022-06-11 DIAGNOSIS — M549 Dorsalgia, unspecified: Secondary | ICD-10-CM | POA: Diagnosis not present

## 2022-06-29 DIAGNOSIS — M545 Low back pain, unspecified: Secondary | ICD-10-CM | POA: Diagnosis not present

## 2022-06-29 DIAGNOSIS — M549 Dorsalgia, unspecified: Secondary | ICD-10-CM | POA: Diagnosis not present

## 2022-06-29 DIAGNOSIS — Z23 Encounter for immunization: Secondary | ICD-10-CM | POA: Diagnosis not present

## 2022-06-29 DIAGNOSIS — R079 Chest pain, unspecified: Secondary | ICD-10-CM | POA: Diagnosis not present

## 2022-07-20 DIAGNOSIS — M546 Pain in thoracic spine: Secondary | ICD-10-CM | POA: Diagnosis not present

## 2022-07-31 DIAGNOSIS — N183 Chronic kidney disease, stage 3 unspecified: Secondary | ICD-10-CM | POA: Diagnosis not present

## 2022-07-31 DIAGNOSIS — M546 Pain in thoracic spine: Secondary | ICD-10-CM | POA: Diagnosis not present

## 2022-08-02 DIAGNOSIS — M546 Pain in thoracic spine: Secondary | ICD-10-CM | POA: Diagnosis not present

## 2022-08-07 DIAGNOSIS — M546 Pain in thoracic spine: Secondary | ICD-10-CM | POA: Diagnosis not present

## 2022-08-07 DIAGNOSIS — N183 Chronic kidney disease, stage 3 unspecified: Secondary | ICD-10-CM | POA: Diagnosis not present

## 2022-08-07 DIAGNOSIS — Z0001 Encounter for general adult medical examination with abnormal findings: Secondary | ICD-10-CM | POA: Diagnosis not present

## 2022-08-07 DIAGNOSIS — E7800 Pure hypercholesterolemia, unspecified: Secondary | ICD-10-CM | POA: Diagnosis not present

## 2022-08-07 DIAGNOSIS — Z0000 Encounter for general adult medical examination without abnormal findings: Secondary | ICD-10-CM | POA: Diagnosis not present

## 2022-08-07 DIAGNOSIS — I129 Hypertensive chronic kidney disease with stage 1 through stage 4 chronic kidney disease, or unspecified chronic kidney disease: Secondary | ICD-10-CM | POA: Diagnosis not present

## 2022-08-07 DIAGNOSIS — H401130 Primary open-angle glaucoma, bilateral, stage unspecified: Secondary | ICD-10-CM | POA: Diagnosis not present

## 2022-08-07 DIAGNOSIS — Z1331 Encounter for screening for depression: Secondary | ICD-10-CM | POA: Diagnosis not present

## 2022-08-10 ENCOUNTER — Other Ambulatory Visit: Payer: Self-pay | Admitting: Internal Medicine

## 2022-08-10 DIAGNOSIS — R058 Other specified cough: Secondary | ICD-10-CM | POA: Diagnosis not present

## 2022-08-10 DIAGNOSIS — Z1231 Encounter for screening mammogram for malignant neoplasm of breast: Secondary | ICD-10-CM

## 2022-08-10 DIAGNOSIS — J069 Acute upper respiratory infection, unspecified: Secondary | ICD-10-CM | POA: Diagnosis not present

## 2022-08-10 DIAGNOSIS — U071 COVID-19: Secondary | ICD-10-CM | POA: Diagnosis not present

## 2022-08-21 DIAGNOSIS — M546 Pain in thoracic spine: Secondary | ICD-10-CM | POA: Diagnosis not present

## 2022-08-23 DIAGNOSIS — M546 Pain in thoracic spine: Secondary | ICD-10-CM | POA: Diagnosis not present

## 2022-08-28 DIAGNOSIS — M2569 Stiffness of other specified joint, not elsewhere classified: Secondary | ICD-10-CM | POA: Diagnosis not present

## 2022-08-28 DIAGNOSIS — R29898 Other symptoms and signs involving the musculoskeletal system: Secondary | ICD-10-CM | POA: Diagnosis not present

## 2022-08-28 DIAGNOSIS — M546 Pain in thoracic spine: Secondary | ICD-10-CM | POA: Diagnosis not present

## 2022-08-31 DIAGNOSIS — M546 Pain in thoracic spine: Secondary | ICD-10-CM | POA: Diagnosis not present

## 2022-09-04 DIAGNOSIS — R29898 Other symptoms and signs involving the musculoskeletal system: Secondary | ICD-10-CM | POA: Diagnosis not present

## 2022-09-04 DIAGNOSIS — M546 Pain in thoracic spine: Secondary | ICD-10-CM | POA: Diagnosis not present

## 2022-09-04 DIAGNOSIS — M2569 Stiffness of other specified joint, not elsewhere classified: Secondary | ICD-10-CM | POA: Diagnosis not present

## 2022-09-07 DIAGNOSIS — M546 Pain in thoracic spine: Secondary | ICD-10-CM | POA: Diagnosis not present

## 2022-09-11 DIAGNOSIS — M546 Pain in thoracic spine: Secondary | ICD-10-CM | POA: Diagnosis not present

## 2022-09-14 DIAGNOSIS — R29898 Other symptoms and signs involving the musculoskeletal system: Secondary | ICD-10-CM | POA: Diagnosis not present

## 2022-09-14 DIAGNOSIS — M2569 Stiffness of other specified joint, not elsewhere classified: Secondary | ICD-10-CM | POA: Diagnosis not present

## 2022-09-14 DIAGNOSIS — M546 Pain in thoracic spine: Secondary | ICD-10-CM | POA: Diagnosis not present

## 2022-09-17 DIAGNOSIS — M546 Pain in thoracic spine: Secondary | ICD-10-CM | POA: Diagnosis not present

## 2022-09-17 DIAGNOSIS — R29898 Other symptoms and signs involving the musculoskeletal system: Secondary | ICD-10-CM | POA: Diagnosis not present

## 2022-09-17 DIAGNOSIS — M2569 Stiffness of other specified joint, not elsewhere classified: Secondary | ICD-10-CM | POA: Diagnosis not present

## 2022-09-18 ENCOUNTER — Ambulatory Visit
Admission: RE | Admit: 2022-09-18 | Discharge: 2022-09-18 | Disposition: A | Discharge status: Home / Self Care | Payer: Medicare HMO | Source: Ambulatory Visit | Attending: Internal Medicine | Admitting: Internal Medicine

## 2022-09-18 DIAGNOSIS — Z1231 Encounter for screening mammogram for malignant neoplasm of breast: Secondary | ICD-10-CM | POA: Insufficient documentation

## 2022-09-21 DIAGNOSIS — M546 Pain in thoracic spine: Secondary | ICD-10-CM | POA: Diagnosis not present

## 2022-09-25 DIAGNOSIS — M546 Pain in thoracic spine: Secondary | ICD-10-CM | POA: Diagnosis not present

## 2022-09-25 DIAGNOSIS — M2569 Stiffness of other specified joint, not elsewhere classified: Secondary | ICD-10-CM | POA: Diagnosis not present

## 2022-09-25 DIAGNOSIS — R29898 Other symptoms and signs involving the musculoskeletal system: Secondary | ICD-10-CM | POA: Diagnosis not present

## 2022-09-28 DIAGNOSIS — M2569 Stiffness of other specified joint, not elsewhere classified: Secondary | ICD-10-CM | POA: Diagnosis not present

## 2022-09-28 DIAGNOSIS — M546 Pain in thoracic spine: Secondary | ICD-10-CM | POA: Diagnosis not present

## 2022-09-28 DIAGNOSIS — R29898 Other symptoms and signs involving the musculoskeletal system: Secondary | ICD-10-CM | POA: Diagnosis not present

## 2022-10-02 DIAGNOSIS — M546 Pain in thoracic spine: Secondary | ICD-10-CM | POA: Diagnosis not present

## 2022-10-02 DIAGNOSIS — M2569 Stiffness of other specified joint, not elsewhere classified: Secondary | ICD-10-CM | POA: Diagnosis not present

## 2022-10-02 DIAGNOSIS — R29898 Other symptoms and signs involving the musculoskeletal system: Secondary | ICD-10-CM | POA: Diagnosis not present

## 2022-10-04 DIAGNOSIS — M546 Pain in thoracic spine: Secondary | ICD-10-CM | POA: Diagnosis not present

## 2022-10-04 DIAGNOSIS — R29898 Other symptoms and signs involving the musculoskeletal system: Secondary | ICD-10-CM | POA: Diagnosis not present

## 2022-10-04 DIAGNOSIS — M2569 Stiffness of other specified joint, not elsewhere classified: Secondary | ICD-10-CM | POA: Diagnosis not present

## 2022-11-21 DIAGNOSIS — Z961 Presence of intraocular lens: Secondary | ICD-10-CM | POA: Diagnosis not present

## 2022-11-21 DIAGNOSIS — Z0100 Encounter for examination of eyes and vision without abnormal findings: Secondary | ICD-10-CM | POA: Diagnosis not present

## 2022-11-21 DIAGNOSIS — H40003 Preglaucoma, unspecified, bilateral: Secondary | ICD-10-CM | POA: Diagnosis not present

## 2023-01-07 DIAGNOSIS — E669 Obesity, unspecified: Secondary | ICD-10-CM | POA: Diagnosis not present

## 2023-01-07 DIAGNOSIS — M7552 Bursitis of left shoulder: Secondary | ICD-10-CM | POA: Diagnosis not present

## 2023-01-29 DIAGNOSIS — N183 Chronic kidney disease, stage 3 unspecified: Secondary | ICD-10-CM | POA: Diagnosis not present

## 2023-02-05 DIAGNOSIS — N183 Chronic kidney disease, stage 3 unspecified: Secondary | ICD-10-CM | POA: Diagnosis not present

## 2023-02-05 DIAGNOSIS — E7800 Pure hypercholesterolemia, unspecified: Secondary | ICD-10-CM | POA: Diagnosis not present

## 2023-02-05 DIAGNOSIS — I10 Essential (primary) hypertension: Secondary | ICD-10-CM | POA: Diagnosis not present

## 2023-02-05 DIAGNOSIS — H401130 Primary open-angle glaucoma, bilateral, stage unspecified: Secondary | ICD-10-CM | POA: Diagnosis not present

## 2023-02-21 DIAGNOSIS — Z96653 Presence of artificial knee joint, bilateral: Secondary | ICD-10-CM | POA: Diagnosis not present

## 2023-03-22 DIAGNOSIS — E785 Hyperlipidemia, unspecified: Secondary | ICD-10-CM | POA: Diagnosis not present

## 2023-03-22 DIAGNOSIS — N2581 Secondary hyperparathyroidism of renal origin: Secondary | ICD-10-CM | POA: Diagnosis not present

## 2023-03-22 DIAGNOSIS — I129 Hypertensive chronic kidney disease with stage 1 through stage 4 chronic kidney disease, or unspecified chronic kidney disease: Secondary | ICD-10-CM | POA: Diagnosis not present

## 2023-03-22 DIAGNOSIS — D631 Anemia in chronic kidney disease: Secondary | ICD-10-CM | POA: Diagnosis not present

## 2023-03-22 DIAGNOSIS — N189 Chronic kidney disease, unspecified: Secondary | ICD-10-CM | POA: Diagnosis not present

## 2023-03-22 DIAGNOSIS — N184 Chronic kidney disease, stage 4 (severe): Secondary | ICD-10-CM | POA: Diagnosis not present

## 2023-03-25 ENCOUNTER — Other Ambulatory Visit: Payer: Self-pay | Admitting: Nephrology

## 2023-03-25 DIAGNOSIS — N184 Chronic kidney disease, stage 4 (severe): Secondary | ICD-10-CM

## 2023-04-02 ENCOUNTER — Ambulatory Visit
Admission: RE | Admit: 2023-04-02 | Discharge: 2023-04-02 | Disposition: A | Discharge status: Home / Self Care | Payer: Medicare HMO | Source: Ambulatory Visit | Attending: Nephrology | Admitting: Nephrology

## 2023-04-02 DIAGNOSIS — N184 Chronic kidney disease, stage 4 (severe): Secondary | ICD-10-CM

## 2023-05-13 DIAGNOSIS — J069 Acute upper respiratory infection, unspecified: Secondary | ICD-10-CM | POA: Diagnosis not present

## 2023-05-24 DIAGNOSIS — Z5321 Procedure and treatment not carried out due to patient leaving prior to being seen by health care provider: Secondary | ICD-10-CM | POA: Diagnosis not present

## 2023-05-24 DIAGNOSIS — R112 Nausea with vomiting, unspecified: Secondary | ICD-10-CM | POA: Diagnosis not present

## 2023-05-27 DIAGNOSIS — Z961 Presence of intraocular lens: Secondary | ICD-10-CM | POA: Diagnosis not present

## 2023-05-27 DIAGNOSIS — H40003 Preglaucoma, unspecified, bilateral: Secondary | ICD-10-CM | POA: Diagnosis not present

## 2023-06-26 DIAGNOSIS — Z23 Encounter for immunization: Secondary | ICD-10-CM | POA: Diagnosis not present

## 2023-08-08 DIAGNOSIS — N183 Chronic kidney disease, stage 3 unspecified: Secondary | ICD-10-CM | POA: Diagnosis not present

## 2023-08-08 DIAGNOSIS — R7309 Other abnormal glucose: Secondary | ICD-10-CM | POA: Diagnosis not present

## 2023-08-12 ENCOUNTER — Other Ambulatory Visit: Payer: Self-pay | Admitting: Internal Medicine

## 2023-08-12 DIAGNOSIS — Z1231 Encounter for screening mammogram for malignant neoplasm of breast: Secondary | ICD-10-CM

## 2023-08-14 DIAGNOSIS — Z1331 Encounter for screening for depression: Secondary | ICD-10-CM | POA: Diagnosis not present

## 2023-08-14 DIAGNOSIS — I129 Hypertensive chronic kidney disease with stage 1 through stage 4 chronic kidney disease, or unspecified chronic kidney disease: Secondary | ICD-10-CM | POA: Diagnosis not present

## 2023-08-14 DIAGNOSIS — E6609 Other obesity due to excess calories: Secondary | ICD-10-CM | POA: Diagnosis not present

## 2023-08-14 DIAGNOSIS — Z6833 Body mass index (BMI) 33.0-33.9, adult: Secondary | ICD-10-CM | POA: Diagnosis not present

## 2023-08-14 DIAGNOSIS — Z0000 Encounter for general adult medical examination without abnormal findings: Secondary | ICD-10-CM | POA: Diagnosis not present

## 2023-08-14 DIAGNOSIS — E7800 Pure hypercholesterolemia, unspecified: Secondary | ICD-10-CM | POA: Diagnosis not present

## 2023-08-14 DIAGNOSIS — N183 Chronic kidney disease, stage 3 unspecified: Secondary | ICD-10-CM | POA: Diagnosis not present

## 2023-08-14 DIAGNOSIS — Z0001 Encounter for general adult medical examination with abnormal findings: Secondary | ICD-10-CM | POA: Diagnosis not present

## 2023-09-20 ENCOUNTER — Ambulatory Visit
Admission: RE | Admit: 2023-09-20 | Discharge: 2023-09-20 | Disposition: A | Discharge status: Home / Self Care | Payer: Medicare HMO | Source: Ambulatory Visit | Attending: Internal Medicine | Admitting: Internal Medicine

## 2023-09-20 DIAGNOSIS — Z1231 Encounter for screening mammogram for malignant neoplasm of breast: Secondary | ICD-10-CM | POA: Insufficient documentation

## 2023-09-25 DIAGNOSIS — I129 Hypertensive chronic kidney disease with stage 1 through stage 4 chronic kidney disease, or unspecified chronic kidney disease: Secondary | ICD-10-CM | POA: Diagnosis not present

## 2023-09-25 DIAGNOSIS — N2581 Secondary hyperparathyroidism of renal origin: Secondary | ICD-10-CM | POA: Diagnosis not present

## 2023-09-25 DIAGNOSIS — N184 Chronic kidney disease, stage 4 (severe): Secondary | ICD-10-CM | POA: Diagnosis not present

## 2023-09-25 DIAGNOSIS — D631 Anemia in chronic kidney disease: Secondary | ICD-10-CM | POA: Diagnosis not present

## 2023-09-25 DIAGNOSIS — E785 Hyperlipidemia, unspecified: Secondary | ICD-10-CM | POA: Diagnosis not present

## 2023-11-19 DIAGNOSIS — H16229 Keratoconjunctivitis sicca, not specified as Sjogren's, unspecified eye: Secondary | ICD-10-CM | POA: Diagnosis not present

## 2023-11-19 DIAGNOSIS — Z0100 Encounter for examination of eyes and vision without abnormal findings: Secondary | ICD-10-CM | POA: Diagnosis not present

## 2023-11-19 DIAGNOSIS — H40003 Preglaucoma, unspecified, bilateral: Secondary | ICD-10-CM | POA: Diagnosis not present

## 2023-11-19 DIAGNOSIS — Z961 Presence of intraocular lens: Secondary | ICD-10-CM | POA: Diagnosis not present

## 2023-12-06 DIAGNOSIS — S46812A Strain of other muscles, fascia and tendons at shoulder and upper arm level, left arm, initial encounter: Secondary | ICD-10-CM | POA: Diagnosis not present

## 2023-12-06 DIAGNOSIS — M25512 Pain in left shoulder: Secondary | ICD-10-CM | POA: Diagnosis not present

## 2024-02-04 DIAGNOSIS — I10 Essential (primary) hypertension: Secondary | ICD-10-CM | POA: Diagnosis not present

## 2024-02-19 DIAGNOSIS — E7800 Pure hypercholesterolemia, unspecified: Secondary | ICD-10-CM | POA: Diagnosis not present

## 2024-02-19 DIAGNOSIS — N183 Chronic kidney disease, stage 3 unspecified: Secondary | ICD-10-CM | POA: Diagnosis not present

## 2024-02-19 DIAGNOSIS — H401130 Primary open-angle glaucoma, bilateral, stage unspecified: Secondary | ICD-10-CM | POA: Diagnosis not present

## 2024-02-19 DIAGNOSIS — I129 Hypertensive chronic kidney disease with stage 1 through stage 4 chronic kidney disease, or unspecified chronic kidney disease: Secondary | ICD-10-CM | POA: Diagnosis not present

## 2024-02-20 DIAGNOSIS — Z96651 Presence of right artificial knee joint: Secondary | ICD-10-CM | POA: Diagnosis not present

## 2024-02-21 DIAGNOSIS — M48061 Spinal stenosis, lumbar region without neurogenic claudication: Secondary | ICD-10-CM | POA: Diagnosis not present

## 2024-02-21 DIAGNOSIS — M5412 Radiculopathy, cervical region: Secondary | ICD-10-CM | POA: Diagnosis not present

## 2024-02-21 DIAGNOSIS — M47816 Spondylosis without myelopathy or radiculopathy, lumbar region: Secondary | ICD-10-CM | POA: Diagnosis not present

## 2024-02-25 ENCOUNTER — Other Ambulatory Visit: Payer: Self-pay | Admitting: Family Medicine

## 2024-02-25 DIAGNOSIS — M5412 Radiculopathy, cervical region: Secondary | ICD-10-CM

## 2024-02-26 ENCOUNTER — Inpatient Hospital Stay
Admission: RE | Admit: 2024-02-26 | Discharge: 2024-02-26 | Disposition: A | Discharge status: Home / Self Care | Source: Ambulatory Visit | Attending: Family Medicine | Admitting: Family Medicine

## 2024-02-26 DIAGNOSIS — M5013 Cervical disc disorder with radiculopathy, cervicothoracic region: Secondary | ICD-10-CM | POA: Diagnosis not present

## 2024-02-26 DIAGNOSIS — M50123 Cervical disc disorder at C6-C7 level with radiculopathy: Secondary | ICD-10-CM | POA: Diagnosis not present

## 2024-02-26 DIAGNOSIS — M5412 Radiculopathy, cervical region: Secondary | ICD-10-CM

## 2024-02-26 DIAGNOSIS — M4802 Spinal stenosis, cervical region: Secondary | ICD-10-CM | POA: Diagnosis not present

## 2024-03-17 DIAGNOSIS — M5416 Radiculopathy, lumbar region: Secondary | ICD-10-CM | POA: Diagnosis not present

## 2024-03-17 DIAGNOSIS — M48062 Spinal stenosis, lumbar region with neurogenic claudication: Secondary | ICD-10-CM | POA: Diagnosis not present

## 2024-03-23 DIAGNOSIS — R42 Dizziness and giddiness: Secondary | ICD-10-CM | POA: Diagnosis not present

## 2024-03-23 DIAGNOSIS — R011 Cardiac murmur, unspecified: Secondary | ICD-10-CM | POA: Diagnosis not present

## 2024-03-23 DIAGNOSIS — N183 Chronic kidney disease, stage 3 unspecified: Secondary | ICD-10-CM | POA: Diagnosis not present

## 2024-03-24 DIAGNOSIS — D631 Anemia in chronic kidney disease: Secondary | ICD-10-CM | POA: Diagnosis not present

## 2024-03-24 DIAGNOSIS — I129 Hypertensive chronic kidney disease with stage 1 through stage 4 chronic kidney disease, or unspecified chronic kidney disease: Secondary | ICD-10-CM | POA: Diagnosis not present

## 2024-03-24 DIAGNOSIS — E785 Hyperlipidemia, unspecified: Secondary | ICD-10-CM | POA: Diagnosis not present

## 2024-03-24 DIAGNOSIS — N2581 Secondary hyperparathyroidism of renal origin: Secondary | ICD-10-CM | POA: Diagnosis not present

## 2024-03-24 DIAGNOSIS — N184 Chronic kidney disease, stage 4 (severe): Secondary | ICD-10-CM | POA: Diagnosis not present

## 2024-04-06 DIAGNOSIS — D72829 Elevated white blood cell count, unspecified: Secondary | ICD-10-CM | POA: Diagnosis not present

## 2024-04-06 DIAGNOSIS — R42 Dizziness and giddiness: Secondary | ICD-10-CM | POA: Diagnosis not present

## 2024-04-06 DIAGNOSIS — R011 Cardiac murmur, unspecified: Secondary | ICD-10-CM | POA: Diagnosis not present

## 2024-04-10 DIAGNOSIS — M48062 Spinal stenosis, lumbar region with neurogenic claudication: Secondary | ICD-10-CM | POA: Diagnosis not present

## 2024-04-10 DIAGNOSIS — M5412 Radiculopathy, cervical region: Secondary | ICD-10-CM | POA: Diagnosis not present

## 2024-04-10 DIAGNOSIS — M4802 Spinal stenosis, cervical region: Secondary | ICD-10-CM | POA: Diagnosis not present

## 2024-04-10 DIAGNOSIS — M5416 Radiculopathy, lumbar region: Secondary | ICD-10-CM | POA: Diagnosis not present

## 2024-04-22 DIAGNOSIS — M5412 Radiculopathy, cervical region: Secondary | ICD-10-CM | POA: Diagnosis not present

## 2024-04-22 DIAGNOSIS — M4802 Spinal stenosis, cervical region: Secondary | ICD-10-CM | POA: Diagnosis not present

## 2024-05-12 DIAGNOSIS — H40003 Preglaucoma, unspecified, bilateral: Secondary | ICD-10-CM | POA: Diagnosis not present

## 2024-05-14 DIAGNOSIS — M4802 Spinal stenosis, cervical region: Secondary | ICD-10-CM | POA: Diagnosis not present

## 2024-05-14 DIAGNOSIS — S46812A Strain of other muscles, fascia and tendons at shoulder and upper arm level, left arm, initial encounter: Secondary | ICD-10-CM | POA: Diagnosis not present

## 2024-05-14 DIAGNOSIS — M48062 Spinal stenosis, lumbar region with neurogenic claudication: Secondary | ICD-10-CM | POA: Diagnosis not present

## 2024-05-14 DIAGNOSIS — M5412 Radiculopathy, cervical region: Secondary | ICD-10-CM | POA: Diagnosis not present

## 2024-05-14 DIAGNOSIS — M25512 Pain in left shoulder: Secondary | ICD-10-CM | POA: Diagnosis not present

## 2024-05-14 DIAGNOSIS — M5416 Radiculopathy, lumbar region: Secondary | ICD-10-CM | POA: Diagnosis not present

## 2024-05-19 DIAGNOSIS — H4010X0 Unspecified open-angle glaucoma, stage unspecified: Secondary | ICD-10-CM | POA: Diagnosis not present

## 2024-06-08 DIAGNOSIS — M79674 Pain in right toe(s): Secondary | ICD-10-CM | POA: Diagnosis not present
